# Patient Record
Sex: Male | Born: 1950 | Race: White | Hispanic: No | Marital: Married | Smoking: Former smoker
Health system: Southern US, Community
[De-identification: ages and names within clinical notes are randomized; demographics above are authoritative.]

## PROBLEM LIST (undated history)

## (undated) DIAGNOSIS — I4891 Unspecified atrial fibrillation: Secondary | ICD-10-CM

## (undated) DIAGNOSIS — E119 Type 2 diabetes mellitus without complications: Secondary | ICD-10-CM

## (undated) DIAGNOSIS — I10 Essential (primary) hypertension: Secondary | ICD-10-CM

## (undated) HISTORY — PX: WRIST SURGERY: SHX841

---

## 2003-08-29 ENCOUNTER — Ambulatory Visit (HOSPITAL_COMMUNITY): Admission: RE | Admit: 2003-08-29 | Discharge: 2003-08-30 | Payer: Self-pay | Admitting: Orthopedic Surgery

## 2015-02-25 DIAGNOSIS — D489 Neoplasm of uncertain behavior, unspecified: Secondary | ICD-10-CM | POA: Diagnosis not present

## 2015-02-25 DIAGNOSIS — Z6833 Body mass index (BMI) 33.0-33.9, adult: Secondary | ICD-10-CM | POA: Diagnosis not present

## 2015-04-15 DIAGNOSIS — Z23 Encounter for immunization: Secondary | ICD-10-CM | POA: Diagnosis not present

## 2015-05-14 DIAGNOSIS — J209 Acute bronchitis, unspecified: Secondary | ICD-10-CM | POA: Diagnosis not present

## 2015-09-01 DIAGNOSIS — I10 Essential (primary) hypertension: Secondary | ICD-10-CM | POA: Diagnosis not present

## 2015-09-01 DIAGNOSIS — E669 Obesity, unspecified: Secondary | ICD-10-CM | POA: Diagnosis not present

## 2015-09-01 DIAGNOSIS — Z125 Encounter for screening for malignant neoplasm of prostate: Secondary | ICD-10-CM | POA: Diagnosis not present

## 2015-09-01 DIAGNOSIS — Z9181 History of falling: Secondary | ICD-10-CM | POA: Diagnosis not present

## 2015-09-01 DIAGNOSIS — N529 Male erectile dysfunction, unspecified: Secondary | ICD-10-CM | POA: Diagnosis not present

## 2015-09-01 DIAGNOSIS — Z6834 Body mass index (BMI) 34.0-34.9, adult: Secondary | ICD-10-CM | POA: Diagnosis not present

## 2015-09-01 DIAGNOSIS — Z1389 Encounter for screening for other disorder: Secondary | ICD-10-CM | POA: Diagnosis not present

## 2015-09-01 DIAGNOSIS — Z0000 Encounter for general adult medical examination without abnormal findings: Secondary | ICD-10-CM | POA: Diagnosis not present

## 2015-09-01 DIAGNOSIS — Z23 Encounter for immunization: Secondary | ICD-10-CM | POA: Diagnosis not present

## 2015-11-14 DIAGNOSIS — H8113 Benign paroxysmal vertigo, bilateral: Secondary | ICD-10-CM | POA: Diagnosis not present

## 2015-11-14 DIAGNOSIS — S0502XA Injury of conjunctiva and corneal abrasion without foreign body, left eye, initial encounter: Secondary | ICD-10-CM | POA: Diagnosis not present

## 2015-12-13 DIAGNOSIS — H401121 Primary open-angle glaucoma, left eye, mild stage: Secondary | ICD-10-CM | POA: Diagnosis not present

## 2016-01-10 DIAGNOSIS — H4010X3 Unspecified open-angle glaucoma, severe stage: Secondary | ICD-10-CM | POA: Diagnosis not present

## 2016-04-21 DIAGNOSIS — Z23 Encounter for immunization: Secondary | ICD-10-CM | POA: Diagnosis not present

## 2016-06-14 DIAGNOSIS — H401121 Primary open-angle glaucoma, left eye, mild stage: Secondary | ICD-10-CM | POA: Diagnosis not present

## 2016-06-14 DIAGNOSIS — H401111 Primary open-angle glaucoma, right eye, mild stage: Secondary | ICD-10-CM | POA: Diagnosis not present

## 2016-09-05 DIAGNOSIS — N529 Male erectile dysfunction, unspecified: Secondary | ICD-10-CM | POA: Diagnosis not present

## 2016-09-05 DIAGNOSIS — E785 Hyperlipidemia, unspecified: Secondary | ICD-10-CM | POA: Diagnosis not present

## 2016-09-05 DIAGNOSIS — Z6835 Body mass index (BMI) 35.0-35.9, adult: Secondary | ICD-10-CM | POA: Diagnosis not present

## 2016-09-05 DIAGNOSIS — R739 Hyperglycemia, unspecified: Secondary | ICD-10-CM | POA: Diagnosis not present

## 2016-09-05 DIAGNOSIS — Z1389 Encounter for screening for other disorder: Secondary | ICD-10-CM | POA: Diagnosis not present

## 2016-09-05 DIAGNOSIS — Z0000 Encounter for general adult medical examination without abnormal findings: Secondary | ICD-10-CM | POA: Diagnosis not present

## 2016-09-05 DIAGNOSIS — Z9181 History of falling: Secondary | ICD-10-CM | POA: Diagnosis not present

## 2016-09-05 DIAGNOSIS — Z125 Encounter for screening for malignant neoplasm of prostate: Secondary | ICD-10-CM | POA: Diagnosis not present

## 2016-09-05 DIAGNOSIS — I10 Essential (primary) hypertension: Secondary | ICD-10-CM | POA: Diagnosis not present

## 2016-09-06 DIAGNOSIS — Z23 Encounter for immunization: Secondary | ICD-10-CM | POA: Diagnosis not present

## 2016-09-25 DIAGNOSIS — H401121 Primary open-angle glaucoma, left eye, mild stage: Secondary | ICD-10-CM | POA: Diagnosis not present

## 2016-09-25 DIAGNOSIS — H401111 Primary open-angle glaucoma, right eye, mild stage: Secondary | ICD-10-CM | POA: Diagnosis not present

## 2016-09-27 DIAGNOSIS — H40001 Preglaucoma, unspecified, right eye: Secondary | ICD-10-CM | POA: Diagnosis not present

## 2016-09-27 DIAGNOSIS — H25813 Combined forms of age-related cataract, bilateral: Secondary | ICD-10-CM | POA: Diagnosis not present

## 2016-09-27 DIAGNOSIS — H4032X3 Glaucoma secondary to eye trauma, left eye, severe stage: Secondary | ICD-10-CM | POA: Diagnosis not present

## 2016-09-27 DIAGNOSIS — H1851 Endothelial corneal dystrophy: Secondary | ICD-10-CM | POA: Diagnosis not present

## 2016-11-09 DIAGNOSIS — H40001 Preglaucoma, unspecified, right eye: Secondary | ICD-10-CM | POA: Diagnosis not present

## 2016-11-09 DIAGNOSIS — H4032X3 Glaucoma secondary to eye trauma, left eye, severe stage: Secondary | ICD-10-CM | POA: Diagnosis not present

## 2016-12-25 DIAGNOSIS — H25813 Combined forms of age-related cataract, bilateral: Secondary | ICD-10-CM | POA: Diagnosis not present

## 2016-12-25 DIAGNOSIS — H401123 Primary open-angle glaucoma, left eye, severe stage: Secondary | ICD-10-CM | POA: Diagnosis not present

## 2017-03-06 DIAGNOSIS — H60509 Unspecified acute noninfective otitis externa, unspecified ear: Secondary | ICD-10-CM | POA: Diagnosis not present

## 2017-03-06 DIAGNOSIS — Z6834 Body mass index (BMI) 34.0-34.9, adult: Secondary | ICD-10-CM | POA: Diagnosis not present

## 2017-03-30 DIAGNOSIS — H401123 Primary open-angle glaucoma, left eye, severe stage: Secondary | ICD-10-CM | POA: Diagnosis not present

## 2017-04-09 DIAGNOSIS — Z23 Encounter for immunization: Secondary | ICD-10-CM | POA: Diagnosis not present

## 2017-05-23 DIAGNOSIS — J029 Acute pharyngitis, unspecified: Secondary | ICD-10-CM | POA: Diagnosis not present

## 2017-05-23 DIAGNOSIS — J301 Allergic rhinitis due to pollen: Secondary | ICD-10-CM | POA: Diagnosis not present

## 2017-05-23 DIAGNOSIS — Z6835 Body mass index (BMI) 35.0-35.9, adult: Secondary | ICD-10-CM | POA: Diagnosis not present

## 2017-05-23 DIAGNOSIS — J019 Acute sinusitis, unspecified: Secondary | ICD-10-CM | POA: Diagnosis not present

## 2017-05-23 DIAGNOSIS — E669 Obesity, unspecified: Secondary | ICD-10-CM | POA: Diagnosis not present

## 2017-07-26 DIAGNOSIS — J189 Pneumonia, unspecified organism: Secondary | ICD-10-CM | POA: Diagnosis not present

## 2017-07-26 DIAGNOSIS — Z6835 Body mass index (BMI) 35.0-35.9, adult: Secondary | ICD-10-CM | POA: Diagnosis not present

## 2017-07-26 DIAGNOSIS — J9801 Acute bronchospasm: Secondary | ICD-10-CM | POA: Diagnosis not present

## 2017-07-26 DIAGNOSIS — R05 Cough: Secondary | ICD-10-CM | POA: Diagnosis not present

## 2017-07-27 DIAGNOSIS — R6889 Other general symptoms and signs: Secondary | ICD-10-CM | POA: Diagnosis not present

## 2017-07-31 DIAGNOSIS — H401111 Primary open-angle glaucoma, right eye, mild stage: Secondary | ICD-10-CM | POA: Diagnosis not present

## 2017-07-31 DIAGNOSIS — H401123 Primary open-angle glaucoma, left eye, severe stage: Secondary | ICD-10-CM | POA: Diagnosis not present

## 2017-07-31 DIAGNOSIS — Z0100 Encounter for examination of eyes and vision without abnormal findings: Secondary | ICD-10-CM | POA: Diagnosis not present

## 2017-08-14 ENCOUNTER — Emergency Department (HOSPITAL_COMMUNITY): Payer: Commercial Managed Care - HMO

## 2017-08-14 ENCOUNTER — Other Ambulatory Visit: Payer: Self-pay

## 2017-08-14 ENCOUNTER — Encounter (HOSPITAL_COMMUNITY): Payer: Self-pay | Admitting: Emergency Medicine

## 2017-08-14 ENCOUNTER — Emergency Department (HOSPITAL_COMMUNITY)
Admission: EM | Admit: 2017-08-14 | Discharge: 2017-08-14 | Disposition: A | Discharge status: Home / Self Care | Payer: Commercial Managed Care - HMO | Attending: Emergency Medicine | Admitting: Emergency Medicine

## 2017-08-14 DIAGNOSIS — S61219A Laceration without foreign body of unspecified finger without damage to nail, initial encounter: Secondary | ICD-10-CM

## 2017-08-14 DIAGNOSIS — Y929 Unspecified place or not applicable: Secondary | ICD-10-CM | POA: Insufficient documentation

## 2017-08-14 DIAGNOSIS — S61211A Laceration without foreign body of left index finger without damage to nail, initial encounter: Secondary | ICD-10-CM | POA: Diagnosis not present

## 2017-08-14 DIAGNOSIS — Y939 Activity, unspecified: Secondary | ICD-10-CM | POA: Diagnosis not present

## 2017-08-14 DIAGNOSIS — I10 Essential (primary) hypertension: Secondary | ICD-10-CM | POA: Insufficient documentation

## 2017-08-14 DIAGNOSIS — W293XXA Contact with powered garden and outdoor hand tools and machinery, initial encounter: Secondary | ICD-10-CM | POA: Insufficient documentation

## 2017-08-14 DIAGNOSIS — S62655A Nondisplaced fracture of medial phalanx of left ring finger, initial encounter for closed fracture: Secondary | ICD-10-CM | POA: Diagnosis not present

## 2017-08-14 DIAGNOSIS — S61213A Laceration without foreign body of left middle finger without damage to nail, initial encounter: Secondary | ICD-10-CM | POA: Insufficient documentation

## 2017-08-14 DIAGNOSIS — Y999 Unspecified external cause status: Secondary | ICD-10-CM | POA: Insufficient documentation

## 2017-08-14 HISTORY — DX: Essential (primary) hypertension: I10

## 2017-08-14 MED ORDER — BUPIVACAINE HCL 0.25 % IJ SOLN: 10.0000 mL | Freq: Once | INTRAMUSCULAR | Status: DC

## 2017-08-14 MED ORDER — OXYCODONE-ACETAMINOPHEN 5-325 MG PO TABS
1.0000 | ORAL_TABLET | Freq: Once | ORAL | Status: AC
  Administered 2017-08-14: 1 via ORAL
  Filled 2017-08-14: qty 1

## 2017-08-14 MED ORDER — HYDROCODONE-ACETAMINOPHEN 5-325 MG PO TABS: 1.0000 | ORAL_TABLET | Freq: Four times a day (QID) | ORAL | 0 refills | Status: DC | PRN

## 2017-08-14 MED ORDER — CEPHALEXIN 500 MG PO CAPS: 500.0000 mg | ORAL_CAPSULE | Freq: Two times a day (BID) | ORAL | 0 refills | Status: DC

## 2017-08-14 MED ORDER — BUPIVACAINE HCL (PF) 0.25 % IJ SOLN
10.0000 mL | Freq: Once | INTRAMUSCULAR | Status: AC
  Administered 2017-08-14: 10 mL
  Filled 2017-08-14: qty 30

## 2017-08-14 MED ORDER — CEPHALEXIN 250 MG PO CAPS
500.0000 mg | ORAL_CAPSULE | Freq: Once | ORAL | Status: AC
  Administered 2017-08-14: 500 mg via ORAL
  Filled 2017-08-14: qty 2

## 2017-08-14 MED ORDER — LIDOCAINE HCL 2 % IJ SOLN
20.0000 mL | Freq: Once | INTRAMUSCULAR | Status: AC
  Administered 2017-08-14: 400 mg
  Filled 2017-08-14: qty 20

## 2017-08-14 NOTE — ED Provider Notes (Signed)
Patient placed in Quick Look pathway, seen and evaluated   Chief Complaint: left second and third digit laceration.  HPI:   Patient presents with lacerations to left 2nd and 3rd digits after using a hand saw.  Tetanus up-to-date.  Went to urgent care, possible fracture.  Endorses numbness to the distal tip of the second digit.  He is not on blood thinners.  States his heart rate at baseline is around 60bpm but has not followed up with his cardiologist in almost 1 year.  Takes 200 mg of metoprolol daily.  ROS: +wound, +numbness  Physical Exam:   Gen: No distress  Neuro: Awake and Alert  Skin: Warm, dry    Focused Exam: Normal range of motion of the left digits and wrist.  Tenderness to palpation overlying a 5 cm jagged laceration to the palmar aspect of the left second digit.  Also has a more superficial laceration to the left third digit.  Decreased sensation to the distal tip of the left second digit.  5/5 strength of wrist and digits with flexion and extension against resistance.  No snuffbox tenderness.   Initiation of care has begun. The patient has been counseled on the process, plan, and necessity for staying for the completion/evaluation, and the remainder of the medical screening examination    Debroah Baller 08/14/17 1327    Carmin Muskrat, MD 08/14/17 [phone]

## 2017-08-14 NOTE — ED Notes (Signed)
VSS. Pt verbalized understanding of d/c instructions and prescriptions as well as f/u. Ambulatory to lobby with significant other

## 2017-08-14 NOTE — ED Triage Notes (Addendum)
Pt seen at urgent care for left pointer and middle. Sent here for further evaluation. End is numb. Up to date on tetanus. He is bradycardic but takes 200mg  metoprolol.

## 2017-08-14 NOTE — Discharge Instructions (Signed)
Please read attached information. If you experience any new or worsening signs or symptoms please return to the emergency room for evaluation. Please follow-up with your primary care provider or specialist as discussed. Please use medication prescribed only as directed and discontinue taking if you have any concerning signs or symptoms.   °

## 2017-08-14 NOTE — ED Notes (Addendum)
Suture cart and lidocaine at bedside.  

## 2017-08-14 NOTE — ED Provider Notes (Signed)
Frank Chase Provider Note   CSN: [id number] Arrival date & time: 08/14/17  1202     History   Chief Complaint Chief Complaint  Patient presents with  . Extremity Laceration    HPI Frank Chase is a 67 y.o. male.  HPI   67 year old male presents today with complaints of finger laceration.  She notes he was using a skill saw when he cut his second and third digits of his left hand.  Tetanus is up-to-date.  He was seen in urgent care and encouraged to come to the emergency room.  Patient reports some numbness at the distal tip of the second digit.  Patient not currently on blood thinners, denies any other injuries.  Past Medical History:  Diagnosis Date  . Hypertension     There are no active problems to display for this patient.   History reviewed. No pertinent surgical history.     Home Medications    Prior to Admission medications   Medication Sig Start Date End Date Taking? Authorizing Provider  cephALEXin (KEFLEX) 500 MG capsule Take 1 capsule (500 mg total) by mouth 2 (two) times daily. 08/14/17   , Dellis Filbert, PA-C  HYDROcodone-acetaminophen (NORCO/VICODIN) 5-325 MG tablet Take 1 tablet by mouth every 6 (six) hours as needed. 08/14/17   Okey Regal, PA-C    Family History History reviewed. No pertinent family history.  Social History Social History   Tobacco Use  . Smoking status: Not on file  Substance Use Topics  . Alcohol use: Not on file  . Drug use: Not on file    Allergies   Patient has no known allergies.   Review of Systems Review of Systems  All other systems reviewed and are negative.  Physical Exam Updated Vital Signs BP (!) 156/79   Pulse (!) 47   Temp 98.1 F (36.7 C) (Oral)   Resp 18   SpO2 99%    Physical Exam  Constitutional: He is oriented to person, place, and time. He appears well-developed and well-nourished.  HENT:  Head: Normocephalic and atraumatic.  Eyes:  Conjunctivae are normal. Pupils are equal, round, and reactive to light. Right eye exhibits no discharge. Left eye exhibits no discharge. No scleral icterus.  Neck: Normal range of motion. No JVD present. No tracheal deviation present.  Pulmonary/Chest: Effort normal. No stridor.  Musculoskeletal:  Several lacerations noted to the palmar aspect of the left second finger at the distal end, no sign of vascular tendon or bony involvement-full active flexion and extension of the DIP PIP  1 cm laceration over the distal third digit no deep space involvement flexion range of motion  Neurological: He is alert and oriented to person, place, and time. Coordination normal.  Psychiatric: He has a normal mood and affect. His behavior is normal. Judgment and thought content normal.  Nursing note and vitals reviewed.   ED Treatments / Results  Labs (all labs ordered are listed, but only abnormal results are displayed) Labs Reviewed - No data to display  EKG  EKG Interpretation None       Radiology Dg Hand Complete Left  Result Date: 08/14/2017 CLINICAL DATA:  Lacerations with a saw to the PIP joint of the index finger and the distal aspect of the middle finger EXAM: LEFT HAND - COMPLETE 3+ VIEW COMPARISON:  None FINDINGS: Dressing artifacts at distal aspects of LEFT index and middle fingers. Osseous mineralization normal. Joint spaces preserved. Tiny opacities are seen at the radial  aspect of the distal phalanx index finger consistent with tiny radiopaque foreign bodies. No definite acute fracture, dislocation, or bone destruction. IMPRESSION: No definite acute osseous abnormalities. Tiny radiopaque foreign bodies at distal phalanx LEFT index finger. Electronically Signed   By: Lavonia Dana M.D.   On: 08/14/2017 13:58    Procedures .Marland KitchenLaceration Repair Date/Time: 08/14/2017 5:03 PM Performed by: Okey Regal, PA-C Authorized by: Okey Regal, PA-C   Consent:    Consent obtained:  Verbal    Consent given by:  Patient   Risks discussed:  Infection, need for additional repair, nerve damage, pain, poor wound healing, retained foreign body and tendon damage   Alternatives discussed:  No treatment Anesthesia (see MAR for exact dosages):    Anesthesia method:  Nerve block   Block needle gauge:  25 G   Block anesthetic:  Lidocaine 2% w/o epi and bupivacaine 0.25% w/o epi   Block technique:  Digital block of the second and third digits left hand   Block injection procedure:  Anatomic landmarks identified, introduced needle, incremental injection, negative aspiration for blood and anatomic landmarks palpated   Block outcome:  Anesthesia achieved Laceration details:    Location: Left second and third fingers.   Length (cm):  5 Repair type:    Repair type:  Simple Pre-procedure details:    Preparation:  Patient was prepped and draped in usual sterile fashion Exploration:    Hemostasis achieved with:  Direct pressure and tourniquet   Wound exploration: wound explored through full range of motion and entire depth of wound probed and visualized     Wound extent: nerve damage     Wound extent: no fascia violation noted, no foreign bodies/material noted, no muscle damage noted, no tendon damage noted, no underlying fracture noted and no vascular damage noted     Contaminated: yes   Treatment:    Area cleansed with:  Saline and soap and water   Amount of cleaning:  Extensive   Irrigation solution:  Tap water and sterile saline   Irrigation volume:  Copious   Visualized foreign bodies/material removed: no   Skin repair:    Repair method:  Sutures   Suture size:  4-0   Suture material:  Fast-absorbing gut   Suture technique:  Simple interrupted   Number of sutures:  12 Approximation:    Approximation:  Close   Vermilion border: well-aligned   Post-procedure details:    Dressing:  Antibiotic ointment, non-adherent dressing and bulky dressing   Patient tolerance of procedure:   Tolerated well, no immediate complications Comments:     I personally bandaged and splinted the laceration and fingers using an aluminum straight splint   (including critical care time)  Medications Ordered in ED Medications  cephALEXin (KEFLEX) capsule 500 mg (not administered)  oxyCODONE-acetaminophen (PERCOCET/ROXICET) 5-325 MG per tablet 1 tablet (1 tablet Oral Given 08/14/17 1526)  lidocaine (XYLOCAINE) 2 % (with pres) injection 400 mg (400 mg Infiltration Given by Other 08/14/17 1520)  bupivacaine (PF) (MARCAINE) 0.25 % injection 10 mL (10 mLs Infiltration Given 08/14/17 1527)     Initial Impression / Assessment and Plan / ED Course  I have reviewed the triage vital signs and the nursing notes.  Pertinent labs & imaging results that were available during my care of the patient were reviewed by me and considered in my medical decision making (see chart for details).     Final Clinical Impressions(s) / ED Diagnoses   Final diagnoses:  Laceration of finger of  left hand without foreign body without damage to nail, unspecified finger, initial encounter    Labs:   Imaging:  Consults:  Therapeutics: Keflex, lidocaine, bupivacaine  Discharge Meds: Keflex, Norco  Assessment/Plan: 67 year old male presents today with lacerations.  These were copiously irrigated and closed.  No signs of deep space involvement full active range of motion of fingers.  Wound is bandaged patient placed on prophylactic antibiotics.  Tetanus up-to-date.  Strict return precautions given.  Patient verbalized understanding and agreement to today's plan.   ED Discharge Orders        Ordered    cephALEXin (KEFLEX) 500 MG capsule  2 times daily     08/14/17 1707    HYDROcodone-acetaminophen (NORCO/VICODIN) 5-325 MG tablet  Every 6 hours PRN     08/14/17 1708       Okey Regal, PA-C 08/14/17 1709    Fredia Sorrow, MD 08/15/17 1715

## 2017-08-14 NOTE — ED Notes (Signed)
EDP notified suture cart by the bedside

## 2017-08-14 NOTE — ED Notes (Signed)
ED Provider at bedside. 

## 2017-09-06 DIAGNOSIS — I10 Essential (primary) hypertension: Secondary | ICD-10-CM | POA: Diagnosis not present

## 2017-09-06 DIAGNOSIS — E785 Hyperlipidemia, unspecified: Secondary | ICD-10-CM | POA: Diagnosis not present

## 2017-09-06 DIAGNOSIS — Z6835 Body mass index (BMI) 35.0-35.9, adult: Secondary | ICD-10-CM | POA: Diagnosis not present

## 2017-09-06 DIAGNOSIS — R739 Hyperglycemia, unspecified: Secondary | ICD-10-CM | POA: Diagnosis not present

## 2017-09-06 DIAGNOSIS — Z125 Encounter for screening for malignant neoplasm of prostate: Secondary | ICD-10-CM | POA: Diagnosis not present

## 2017-09-06 DIAGNOSIS — Z0000 Encounter for general adult medical examination without abnormal findings: Secondary | ICD-10-CM | POA: Diagnosis not present

## 2017-12-10 DIAGNOSIS — Z1339 Encounter for screening examination for other mental health and behavioral disorders: Secondary | ICD-10-CM | POA: Diagnosis not present

## 2017-12-10 DIAGNOSIS — E669 Obesity, unspecified: Secondary | ICD-10-CM | POA: Diagnosis not present

## 2017-12-10 DIAGNOSIS — Z1331 Encounter for screening for depression: Secondary | ICD-10-CM | POA: Diagnosis not present

## 2017-12-10 DIAGNOSIS — I10 Essential (primary) hypertension: Secondary | ICD-10-CM | POA: Diagnosis not present

## 2017-12-10 DIAGNOSIS — R739 Hyperglycemia, unspecified: Secondary | ICD-10-CM | POA: Diagnosis not present

## 2017-12-10 DIAGNOSIS — Z9181 History of falling: Secondary | ICD-10-CM | POA: Diagnosis not present

## 2017-12-10 DIAGNOSIS — L570 Actinic keratosis: Secondary | ICD-10-CM | POA: Diagnosis not present

## 2017-12-10 DIAGNOSIS — Z6834 Body mass index (BMI) 34.0-34.9, adult: Secondary | ICD-10-CM | POA: Diagnosis not present

## 2017-12-10 DIAGNOSIS — E785 Hyperlipidemia, unspecified: Secondary | ICD-10-CM | POA: Diagnosis not present

## 2017-12-25 DIAGNOSIS — M818 Other osteoporosis without current pathological fracture: Secondary | ICD-10-CM | POA: Diagnosis not present

## 2017-12-25 DIAGNOSIS — R739 Hyperglycemia, unspecified: Secondary | ICD-10-CM | POA: Diagnosis not present

## 2017-12-25 DIAGNOSIS — Z6833 Body mass index (BMI) 33.0-33.9, adult: Secondary | ICD-10-CM | POA: Diagnosis not present

## 2017-12-25 DIAGNOSIS — Z79899 Other long term (current) drug therapy: Secondary | ICD-10-CM | POA: Diagnosis not present

## 2017-12-25 DIAGNOSIS — E785 Hyperlipidemia, unspecified: Secondary | ICD-10-CM | POA: Diagnosis not present

## 2017-12-25 DIAGNOSIS — I10 Essential (primary) hypertension: Secondary | ICD-10-CM | POA: Diagnosis not present

## 2017-12-31 DIAGNOSIS — H401123 Primary open-angle glaucoma, left eye, severe stage: Secondary | ICD-10-CM | POA: Diagnosis not present

## 2017-12-31 DIAGNOSIS — H401111 Primary open-angle glaucoma, right eye, mild stage: Secondary | ICD-10-CM | POA: Diagnosis not present

## 2018-03-12 DIAGNOSIS — E785 Hyperlipidemia, unspecified: Secondary | ICD-10-CM | POA: Diagnosis not present

## 2018-03-12 DIAGNOSIS — I10 Essential (primary) hypertension: Secondary | ICD-10-CM | POA: Diagnosis not present

## 2018-03-12 DIAGNOSIS — Z6832 Body mass index (BMI) 32.0-32.9, adult: Secondary | ICD-10-CM | POA: Diagnosis not present

## 2018-03-12 DIAGNOSIS — R739 Hyperglycemia, unspecified: Secondary | ICD-10-CM | POA: Diagnosis not present

## 2018-03-12 DIAGNOSIS — Z23 Encounter for immunization: Secondary | ICD-10-CM | POA: Diagnosis not present

## 2018-03-12 DIAGNOSIS — Z1339 Encounter for screening examination for other mental health and behavioral disorders: Secondary | ICD-10-CM | POA: Diagnosis not present

## 2018-06-13 DIAGNOSIS — Z6833 Body mass index (BMI) 33.0-33.9, adult: Secondary | ICD-10-CM | POA: Diagnosis not present

## 2018-06-13 DIAGNOSIS — R739 Hyperglycemia, unspecified: Secondary | ICD-10-CM | POA: Diagnosis not present

## 2018-06-13 DIAGNOSIS — E785 Hyperlipidemia, unspecified: Secondary | ICD-10-CM | POA: Diagnosis not present

## 2018-06-13 DIAGNOSIS — I10 Essential (primary) hypertension: Secondary | ICD-10-CM | POA: Diagnosis not present

## 2018-06-26 DIAGNOSIS — H401111 Primary open-angle glaucoma, right eye, mild stage: Secondary | ICD-10-CM | POA: Diagnosis not present

## 2018-06-26 DIAGNOSIS — H401123 Primary open-angle glaucoma, left eye, severe stage: Secondary | ICD-10-CM | POA: Diagnosis not present

## 2018-08-08 DIAGNOSIS — R6889 Other general symptoms and signs: Secondary | ICD-10-CM | POA: Diagnosis not present

## 2018-08-08 DIAGNOSIS — J020 Streptococcal pharyngitis: Secondary | ICD-10-CM | POA: Diagnosis not present

## 2018-08-08 DIAGNOSIS — Z6833 Body mass index (BMI) 33.0-33.9, adult: Secondary | ICD-10-CM | POA: Diagnosis not present

## 2018-08-08 DIAGNOSIS — I10 Essential (primary) hypertension: Secondary | ICD-10-CM | POA: Diagnosis not present

## 2018-08-08 DIAGNOSIS — J029 Acute pharyngitis, unspecified: Secondary | ICD-10-CM | POA: Diagnosis not present

## 2018-09-19 DIAGNOSIS — I10 Essential (primary) hypertension: Secondary | ICD-10-CM | POA: Diagnosis not present

## 2018-09-19 DIAGNOSIS — Z125 Encounter for screening for malignant neoplasm of prostate: Secondary | ICD-10-CM | POA: Diagnosis not present

## 2018-09-19 DIAGNOSIS — Z6833 Body mass index (BMI) 33.0-33.9, adult: Secondary | ICD-10-CM | POA: Diagnosis not present

## 2018-09-19 DIAGNOSIS — R739 Hyperglycemia, unspecified: Secondary | ICD-10-CM | POA: Diagnosis not present

## 2018-09-19 DIAGNOSIS — E785 Hyperlipidemia, unspecified: Secondary | ICD-10-CM | POA: Diagnosis not present

## 2018-12-19 DIAGNOSIS — Z9181 History of falling: Secondary | ICD-10-CM | POA: Diagnosis not present

## 2018-12-19 DIAGNOSIS — Z1331 Encounter for screening for depression: Secondary | ICD-10-CM | POA: Diagnosis not present

## 2018-12-19 DIAGNOSIS — E785 Hyperlipidemia, unspecified: Secondary | ICD-10-CM | POA: Diagnosis not present

## 2018-12-19 DIAGNOSIS — R739 Hyperglycemia, unspecified: Secondary | ICD-10-CM | POA: Diagnosis not present

## 2018-12-19 DIAGNOSIS — N529 Male erectile dysfunction, unspecified: Secondary | ICD-10-CM | POA: Diagnosis not present

## 2018-12-19 DIAGNOSIS — I10 Essential (primary) hypertension: Secondary | ICD-10-CM | POA: Diagnosis not present

## 2019-01-02 DIAGNOSIS — H401123 Primary open-angle glaucoma, left eye, severe stage: Secondary | ICD-10-CM | POA: Diagnosis not present

## 2019-01-02 DIAGNOSIS — H40011 Open angle with borderline findings, low risk, right eye: Secondary | ICD-10-CM | POA: Diagnosis not present

## 2019-01-02 DIAGNOSIS — H2513 Age-related nuclear cataract, bilateral: Secondary | ICD-10-CM | POA: Diagnosis not present

## 2019-03-21 DIAGNOSIS — N529 Male erectile dysfunction, unspecified: Secondary | ICD-10-CM | POA: Diagnosis not present

## 2019-03-21 DIAGNOSIS — R739 Hyperglycemia, unspecified: Secondary | ICD-10-CM | POA: Diagnosis not present

## 2019-03-21 DIAGNOSIS — Z6834 Body mass index (BMI) 34.0-34.9, adult: Secondary | ICD-10-CM | POA: Diagnosis not present

## 2019-03-21 DIAGNOSIS — Z23 Encounter for immunization: Secondary | ICD-10-CM | POA: Diagnosis not present

## 2019-03-21 DIAGNOSIS — E785 Hyperlipidemia, unspecified: Secondary | ICD-10-CM | POA: Diagnosis not present

## 2019-03-21 DIAGNOSIS — I10 Essential (primary) hypertension: Secondary | ICD-10-CM | POA: Diagnosis not present

## 2019-03-27 DIAGNOSIS — M1711 Unilateral primary osteoarthritis, right knee: Secondary | ICD-10-CM | POA: Diagnosis not present

## 2019-04-14 DIAGNOSIS — I4891 Unspecified atrial fibrillation: Secondary | ICD-10-CM | POA: Diagnosis not present

## 2019-04-14 DIAGNOSIS — Z6834 Body mass index (BMI) 34.0-34.9, adult: Secondary | ICD-10-CM | POA: Diagnosis not present

## 2019-04-14 DIAGNOSIS — I10 Essential (primary) hypertension: Secondary | ICD-10-CM | POA: Diagnosis not present

## 2019-04-14 DIAGNOSIS — I499 Cardiac arrhythmia, unspecified: Secondary | ICD-10-CM | POA: Diagnosis not present

## 2019-04-17 DIAGNOSIS — I361 Nonrheumatic tricuspid (valve) insufficiency: Secondary | ICD-10-CM | POA: Diagnosis not present

## 2019-04-17 DIAGNOSIS — I4891 Unspecified atrial fibrillation: Secondary | ICD-10-CM | POA: Diagnosis not present

## 2019-04-21 DIAGNOSIS — I4891 Unspecified atrial fibrillation: Secondary | ICD-10-CM | POA: Diagnosis not present

## 2019-04-21 DIAGNOSIS — Z6834 Body mass index (BMI) 34.0-34.9, adult: Secondary | ICD-10-CM | POA: Diagnosis not present

## 2019-04-21 DIAGNOSIS — I10 Essential (primary) hypertension: Secondary | ICD-10-CM | POA: Diagnosis not present

## 2019-05-06 NOTE — H&P (View-Only) (Signed)
Cardiology Office Note   Date:  05/07/2019   ID:  [name], DOB April 17, 1951, MRN [id number]  PCP:  Frank Chase., MD  Cardiologist:   No primary care provider on file.   Chief Complaint  Patient presents with  . Atrial Fibrillation     History of Present Illness: Frank Chase is a 68 y.o. male who is referred by Frank Chase., MD for evaluation of atrial fib.  This was found incidentally by his wife.  She noted irregular heart rate and then found it on a blood pressure monitor.  He went to his primary care and was noted to be in atrial fibrillation.  He has had an echocardiogram which was essentially unremarkable.  He does not really feel the fibrillation.  He might have some decreased energy and a little less exercise tolerance but this is mild.  He does not have any presyncope or syncope.  He had one episode of shortness of breath while watching the TV a little while ago but this was one brief episode this is self-limited.  He denies any chest pressure, neck or arm discomfort.  He does not otherwise have any shortness of breath, PND or orthopnea.  He has no weight gain or edema.  Past Medical History:  Diagnosis Date  . Hypertension     Past Surgical History:  Procedure Laterality Date  . WRIST SURGERY       Current Outpatient Medications  Medication Sig Dispense Refill  . amLODipine (NORVASC) 5 MG tablet     . COSOPT 22.3-6.8 MG/ML ophthalmic solution Place 1 drop into the left eye 2 (two) times daily.    . metoprolol succinate (TOPROL-XL) 100 MG 24 hr tablet     . olmesartan-hydrochlorothiazide (BENICAR HCT) 40-12.5 MG tablet     . Travoprost, BAK Free, (TRAVATAN) 0.004 % SOLN ophthalmic solution     . XARELTO 20 MG TABS tablet      No current facility-administered medications for this visit.     Allergies:   Patient has no known allergies.    Social History:  The patient  reports that he quit smoking about 42 years ago. His smoking use included  cigarettes. He quit smokeless tobacco use about 35 years ago. He reports current alcohol use. He reports that he does not use drugs.   Family History:  The patient's family history includes Alzheimer's disease in his mother.    ROS:  Please see the history of present illness.   Otherwise, review of systems are positive for none.   All other systems are reviewed and negative.    PHYSICAL EXAM: VS:  BP (!) 151/97 (BP Location: Left Arm)   Pulse 94   Temp (!) 97.5 F (36.4 C)   Ht 5\' 10"  (1.778 m)   Wt 235 lb 3.2 oz (106.7 kg)   SpO2 97%   BMI 33.75 kg/m  , BMI Body mass index is 33.75 kg/m. GENERAL:  Well appearing HEENT:  Pupils equal round and reactive, fundi not visualized, oral mucosa unremarkable NECK:  No jugular venous distention, waveform within normal limits, carotid upstroke brisk and symmetric, no bruits, no thyromegaly LYMPHATICS:  No cervical, inguinal adenopathy LUNGS:  Clear to auscultation bilaterally BACK:  No CVA tenderness CHEST:  Unremarkable HEART:  PMI not displaced or sustained,S1 and S2 within normal limits, no S3, no clicks, no rubs, no murmurs, irregular ABD:  Flat, positive bowel sounds normal in frequency in pitch, no bruits, no rebound,  no guarding, no midline pulsatile mass, no hepatomegaly, no splenomegaly EXT:  2 plus pulses throughout, trace lower extremity edema, no cyanosis no clubbing SKIN:  No rashes no nodules NEURO:  Cranial nerves II through XII grossly intact, motor grossly intact throughout PSYCH:  Cognitively intact, oriented to person place and time    EKG:  EKG is ordered today. The ekg ordered today demonstrates atrial fibrillation, rate 90, axis within normal limits, intervals within normal limits, no acute ST-T wave changes.   Recent Labs: No results found for requested labs within last 8760 hours.    Lipid Panel No results found for: CHOL, TRIG, HDL, CHOLHDL, VLDL, LDLCALC, LDLDIRECT    Wt Readings from Last 3 Encounters:   05/07/19 235 lb 3.2 oz (106.7 kg)      Other studies Reviewed: Additional studies/ records that were reviewed today include: Echo, lab and office records. Review of the above records demonstrates:  Please see elsewhere in the note.     ASSESSMENT AND PLAN:  ATRIAL FIB:   The patient's rate is controlled.  He is tolerating anticoagulation.  I plan to send him for cardioversion.  He has been on anticoagulation for 3 weeks.  He had normal electrolytes and TSH.  I will see him back after that and make plan he is in recurrent fibrillation.  Frank Chase has a CHA2DS2 - VASc score of 2.    HTN: Blood pressure is mildly elevated.  Elected blood pressure diary and is typically not.  He will keep a watch on this and but if not and his blood pressure stays high will need further med adjustments.   Current medicines are reviewed at length with the patient today.  The patient does not have concerns regarding medicines.  The following changes have been made:  no change  Labs/ tests ordered today include:   Orders Placed This Encounter  Procedures  . CBC  . Basic metabolic panel  . EKG 12-Lead     Disposition:   FU with after the DCCV    Signed, Minus Breeding, MD  05/07/2019 3:35 PM    [facility]

## 2019-05-06 NOTE — Progress Notes (Signed)
Cardiology Office Note   Date:  05/07/2019   ID:  URA SLAIGHT, DOB 1951-02-19, MRN [id number]  PCP:  Frank Chase., MD  Cardiologist:   No primary care provider on file.   Chief Complaint  Patient presents with  . Atrial Fibrillation     History of Present Illness: Frank Chase is a 68 y.o. male who is referred by Frank Chase., MD for evaluation of atrial fib.  This was found incidentally by his wife.  She noted irregular heart rate and then found it on a blood pressure monitor.  He went to his primary care and was noted to be in atrial fibrillation.  He has had an echocardiogram which was essentially unremarkable.  He does not really feel the fibrillation.  He might have some decreased energy and a little less exercise tolerance but this is mild.  He does not have any presyncope or syncope.  He had one episode of shortness of breath while watching the TV a little while ago but this was one brief episode this is self-limited.  He denies any chest pressure, neck or arm discomfort.  He does not otherwise have any shortness of breath, PND or orthopnea.  He has no weight gain or edema.  Past Medical History:  Diagnosis Date  . Hypertension     Past Surgical History:  Procedure Laterality Date  . WRIST SURGERY       Current Outpatient Medications  Medication Sig Dispense Refill  . amLODipine (NORVASC) 5 MG tablet     . COSOPT 22.3-6.8 MG/ML ophthalmic solution Place 1 drop into the left eye 2 (two) times daily.    . metoprolol succinate (TOPROL-XL) 100 MG 24 hr tablet     . olmesartan-hydrochlorothiazide (BENICAR HCT) 40-12.5 MG tablet     . Travoprost, BAK Free, (TRAVATAN) 0.004 % SOLN ophthalmic solution     . XARELTO 20 MG TABS tablet      No current facility-administered medications for this visit.     Allergies:   Patient has no known allergies.    Social History:  The patient  reports that he quit smoking about 42 years ago. His smoking use included  cigarettes. He quit smokeless tobacco use about 35 years ago. He reports current alcohol use. He reports that he does not use drugs.   Family History:  The patient's family history includes Alzheimer's disease in his mother.    ROS:  Please see the history of present illness.   Otherwise, review of systems are positive for none.   All other systems are reviewed and negative.    PHYSICAL EXAM: VS:  BP (!) 151/97 (BP Location: Left Arm)   Pulse 94   Temp (!) 97.5 F (36.4 C)   Ht 5\' 10"  (1.778 m)   Wt 235 lb 3.2 oz (106.7 kg)   SpO2 97%   BMI 33.75 kg/m  , BMI Body mass index is 33.75 kg/m. GENERAL:  Well appearing HEENT:  Pupils equal round and reactive, fundi not visualized, oral mucosa unremarkable NECK:  No jugular venous distention, waveform within normal limits, carotid upstroke brisk and symmetric, no bruits, no thyromegaly LYMPHATICS:  No cervical, inguinal adenopathy LUNGS:  Clear to auscultation bilaterally BACK:  No CVA tenderness CHEST:  Unremarkable HEART:  PMI not displaced or sustained,S1 and S2 within normal limits, no S3, no clicks, no rubs, no murmurs, irregular ABD:  Flat, positive bowel sounds normal in frequency in pitch, no bruits, no rebound,  no guarding, no midline pulsatile mass, no hepatomegaly, no splenomegaly EXT:  2 plus pulses throughout, trace lower extremity edema, no cyanosis no clubbing SKIN:  No rashes no nodules NEURO:  Cranial nerves II through XII grossly intact, motor grossly intact throughout PSYCH:  Cognitively intact, oriented to person place and time    EKG:  EKG is ordered today. The ekg ordered today demonstrates atrial fibrillation, rate 90, axis within normal limits, intervals within normal limits, no acute ST-T wave changes.   Recent Labs: No results found for requested labs within last 8760 hours.    Lipid Panel No results found for: CHOL, TRIG, HDL, CHOLHDL, VLDL, LDLCALC, LDLDIRECT    Wt Readings from Last 3 Encounters:   05/07/19 235 lb 3.2 oz (106.7 kg)      Other studies Reviewed: Additional studies/ records that were reviewed today include: Echo, lab and office records. Review of the above records demonstrates:  Please see elsewhere in the note.     ASSESSMENT AND PLAN:  ATRIAL FIB:   The patient's rate is controlled.  He is tolerating anticoagulation.  I plan to send him for cardioversion.  He has been on anticoagulation for 3 weeks.  He had normal electrolytes and TSH.  I will see him back after that and make plan he is in recurrent fibrillation.  Frank Chase has a CHA2DS2 - VASc score of 2.    HTN: Blood pressure is mildly elevated.  Elected blood pressure diary and is typically not.  He will keep a watch on this and but if not and his blood pressure stays high will need further med adjustments.   Current medicines are reviewed at length with the patient today.  The patient does not have concerns regarding medicines.  The following changes have been made:  no change  Labs/ tests ordered today include:   Orders Placed This Encounter  Procedures  . CBC  . Basic metabolic panel  . EKG 12-Lead     Disposition:   FU with after the DCCV    Signed, Minus Breeding, MD  05/07/2019 3:35 PM    [facility]

## 2019-05-07 ENCOUNTER — Ambulatory Visit (INDEPENDENT_AMBULATORY_CARE_PROVIDER_SITE_OTHER): Payer: Medicare HMO | Admitting: Cardiology

## 2019-05-07 ENCOUNTER — Other Ambulatory Visit: Payer: Self-pay

## 2019-05-07 ENCOUNTER — Encounter: Payer: Self-pay | Admitting: Cardiology

## 2019-05-07 VITALS — BP 151/97 | HR 94 | Temp 97.5°F | Ht 70.0 in | Wt 235.2 lb

## 2019-05-07 DIAGNOSIS — I10 Essential (primary) hypertension: Secondary | ICD-10-CM | POA: Diagnosis not present

## 2019-05-07 DIAGNOSIS — I4891 Unspecified atrial fibrillation: Secondary | ICD-10-CM | POA: Diagnosis not present

## 2019-05-07 NOTE — Patient Instructions (Addendum)
Medication Instructions:  Your physician recommends that you continue on your current medications as directed. Please refer to the Current Medication list given to you today.  If you need a refill on your cardiac medications before your next appointment, please call your pharmacy.   Lab work: CBC, BMET If you have labs (blood work) drawn today and your tests are completely normal, you will receive your results only by: [place] (if you have MyChart) OR A paper copy in the mail If you have any lab test that is abnormal or we need to change your treatment, we will call you to review the results.  Testing/Procedures: Cardioversion on Monday November 23rd at 11am.   Your physician Dr Percival Spanish wants you to follow-up in: 2 weeks    Any Other Special Instructions Will Be Listed Below (If Applicable). Dear Frank Chase You are scheduled for a TEE Cardioversion on Monday November 23rd at 11:00am. with Dr. Acie Fredrickson.  Please arrive at the [facility] (Main Entrance A) at [facility]: [address] [place], [place] [address] at 10:00am. (1 hour prior to procedure unless lab work is needed; if lab work is needed arrive 1.5 hours ahead)  DIET: Nothing to eat or drink after midnight except a sip of water with medications (see medication instructions below)  Medication Instructions: Continue your anticoagulant: Xarelto You will need to continue your anticoagulant after your procedure until you  are told by your  Provider that it is safe to stop   Labs: CBC, BMET today.  You must have a responsible person to drive you home and stay in the waiting area during your procedure. Failure to do so could result in cancellation.  Bring your insurance cards.  *Special Note: Every effort is made to have your procedure done on time. Occasionally there are emergencies that occur at the hospital that may cause delays. Please be patient if a delay does occur.    You have to be tested for  Covid On Thursday November 19th @ 12:20. Arrive 10 minutes early. This is a Drive Up Visit at the [facility] [address] [place]. Someone will direct you to the appropriate testing line. Stay in your car and someone will be with you shortly.

## 2019-05-08 ENCOUNTER — Other Ambulatory Visit (HOSPITAL_COMMUNITY)
Admission: RE | Admit: 2019-05-08 | Discharge: 2019-05-08 | Disposition: A | Discharge status: Home / Self Care | Payer: Medicare HMO | Source: Ambulatory Visit | Attending: Cardiovascular Disease | Admitting: Cardiovascular Disease

## 2019-05-08 DIAGNOSIS — Z01812 Encounter for preprocedural laboratory examination: Secondary | ICD-10-CM | POA: Diagnosis not present

## 2019-05-08 DIAGNOSIS — Z20828 Contact with and (suspected) exposure to other viral communicable diseases: Secondary | ICD-10-CM | POA: Diagnosis not present

## 2019-05-08 LAB — BASIC METABOLIC PANEL
BUN/Creatinine Ratio: 12 (ref 10–24)
BUN: 13 mg/dL (ref 8–27)
CO2: 22 mmol/L (ref 20–29)
Calcium: 9.6 mg/dL (ref 8.6–10.2)
Chloride: 104 mmol/L (ref 96–106)
Creatinine, Ser: 1.05 mg/dL (ref 0.76–1.27)
GFR calc Af Amer: 83 mL/min/{1.73_m2} (ref >59)
GFR calc non Af Amer: 72 mL/min/{1.73_m2} (ref >59)
Glucose: 99 mg/dL (ref 65–99)
Potassium: 3.9 mmol/L (ref 3.5–5.2)
Sodium: 143 mmol/L (ref 134–144)

## 2019-05-08 LAB — CBC
Hematocrit: 46.8 % (ref 37.5–51.0)
Hemoglobin: 16.6 g/dL (ref 13.0–17.7)
MCH: 30.6 pg (ref 26.6–33.0)
MCHC: 35.5 g/dL (ref 31.5–35.7)
MCV: 86 fL (ref 79–97)
Platelets: 218 x10E3/uL (ref 150–450)
RBC: 5.43 x10E6/uL (ref 4.14–5.80)
RDW: 13 % (ref 11.6–15.4)
WBC: 7.2 x10E3/uL (ref 3.4–10.8)

## 2019-05-09 ENCOUNTER — Encounter: Payer: Self-pay | Admitting: *Deleted

## 2019-05-09 LAB — NOVEL CORONAVIRUS, NAA (HOSP ORDER, SEND-OUT TO REF LAB; TAT 18-24 HRS): SARS-CoV-2, NAA: NOT DETECTED

## 2019-05-12 ENCOUNTER — Encounter (HOSPITAL_COMMUNITY): Payer: Self-pay | Admitting: Emergency Medicine

## 2019-05-12 ENCOUNTER — Encounter (HOSPITAL_COMMUNITY)
Admission: RE | Disposition: A | Discharge status: Home / Self Care | Payer: Self-pay | Source: Home / Self Care | Attending: Cardiovascular Disease

## 2019-05-12 ENCOUNTER — Ambulatory Visit (HOSPITAL_COMMUNITY)
Admission: RE | Admit: 2019-05-12 | Discharge: 2019-05-12 | Disposition: A | Discharge status: Home / Self Care | Payer: Medicare HMO | Attending: Cardiovascular Disease | Admitting: Cardiovascular Disease

## 2019-05-12 ENCOUNTER — Ambulatory Visit (HOSPITAL_COMMUNITY): Payer: Medicare HMO | Admitting: Certified Registered Nurse Anesthetist

## 2019-05-12 ENCOUNTER — Other Ambulatory Visit: Payer: Self-pay

## 2019-05-12 DIAGNOSIS — D759 Disease of blood and blood-forming organs, unspecified: Secondary | ICD-10-CM | POA: Diagnosis not present

## 2019-05-12 DIAGNOSIS — I10 Essential (primary) hypertension: Secondary | ICD-10-CM | POA: Diagnosis not present

## 2019-05-12 DIAGNOSIS — Z87891 Personal history of nicotine dependence: Secondary | ICD-10-CM | POA: Insufficient documentation

## 2019-05-12 DIAGNOSIS — Z79899 Other long term (current) drug therapy: Secondary | ICD-10-CM | POA: Insufficient documentation

## 2019-05-12 DIAGNOSIS — Z7901 Long term (current) use of anticoagulants: Secondary | ICD-10-CM | POA: Insufficient documentation

## 2019-05-12 DIAGNOSIS — I4891 Unspecified atrial fibrillation: Secondary | ICD-10-CM | POA: Insufficient documentation

## 2019-05-12 HISTORY — PX: CARDIOVERSION: SHX1299

## 2019-05-12 SURGERY — CARDIOVERSION
Anesthesia: General

## 2019-05-12 MED ORDER — METOPROLOL SUCCINATE ER 50 MG PO TB24: 50.0000 mg | ORAL_TABLET | Freq: Every day | ORAL | 11 refills | Status: DC

## 2019-05-12 MED ORDER — PROPOFOL 10 MG/ML IV BOLUS
INTRAVENOUS | Status: DC | PRN
  Administered 2019-05-12: 70 mg via INTRAVENOUS

## 2019-05-12 MED ORDER — SODIUM CHLORIDE 0.9 % IV SOLN
INTRAVENOUS | Status: DC | PRN
  Administered 2019-05-12: 11:00:00 via INTRAVENOUS

## 2019-05-12 MED ORDER — LIDOCAINE 2% (20 MG/ML) 5 ML SYRINGE
INTRAMUSCULAR | Status: DC | PRN
  Administered 2019-05-12: 80 mg via INTRAVENOUS

## 2019-05-12 NOTE — Transfer of Care (Signed)
Immediate Anesthesia Transfer of Care Note  Patient: Frank Chase  Procedure(s) Performed: CARDIOVERSION (N/A )  Patient Location: Endoscopy Unit  Anesthesia Type:General  Level of Consciousness: drowsy  Airway & Oxygen Therapy: Patient Spontanous Breathing and Patient connected to nasal cannula oxygen  Post-op Assessment: Report given to RN and Post -op Vital signs reviewed and stable  Post vital signs: Reviewed  Last Vitals:  Vitals Value Taken Time  BP    Temp    Pulse    Resp    SpO2      Last Pain:  Vitals:   05/12/19 1042  TempSrc: Temporal  PainSc: 0-No pain         Complications: No apparent anesthesia complications

## 2019-05-12 NOTE — Interval H&P Note (Signed)
History and Physical Interval Note:  05/12/2019 11:14 AM  Frank Chase  has presented today for surgery, with the diagnosis of atrial fibrillation.  The various methods of treatment have been discussed with the patient and family. After consideration of risks, benefits and other options for treatment, the patient has consented to  Procedure(s): CARDIOVERSION (N/A) as a surgical intervention.  The patient's history has been reviewed, patient examined, no change in status, stable for surgery.  I have reviewed the patient's chart and labs.  Questions were answered to the patient's satisfaction.     Mertie Moores

## 2019-05-12 NOTE — Anesthesia Postprocedure Evaluation (Signed)
Anesthesia Post Note  Patient: Frank Chase  Procedure(s) Performed: CARDIOVERSION (N/A )     Patient location during evaluation: Endoscopy Anesthesia Type: General Level of consciousness: awake and alert Pain management: pain level controlled Vital Signs Assessment: post-procedure vital signs reviewed and stable Respiratory status: spontaneous breathing, nonlabored ventilation, respiratory function stable and patient connected to nasal cannula oxygen Cardiovascular status: blood pressure returned to baseline and stable Postop Assessment: no apparent nausea or vomiting Anesthetic complications: no    Last Vitals:  Vitals:   05/12/19 1140 05/12/19 1154  BP: 124/76 (!) 106/55  Pulse: (!) 51 (!) 55  Resp: 17   Temp:    SpO2: 95% 96%    Last Pain:  Vitals:   05/12/19 1154  TempSrc:   PainSc: 0-No pain                  L 

## 2019-05-12 NOTE — CV Procedure (Signed)
    Cardioversion Note  MASUO GOVEIA [id number] 19-Aug-1950  Procedure: DC Cardioversion Indications: atrial fib   Procedure Details Consent: Obtained Time Out: Verified patient identification, verified procedure, site/side was marked, verified correct patient position, special equipment/implants available, Radiology Safety Procedures followed,  medications/allergies/relevent history reviewed, required imaging and test results available.  Performed  The patient has been on adequate anticoagulation.  The patient received IV  Lidocaine 80 mg followed by propofol 70 mg IV  for sedation.  Synchronous cardioversion was performed at 200  joules.  The cardioversion was successful     Complications: No apparent complications Patient did tolerate procedure well.   Thayer Headings, Brooke Bonito., MD, [facility] 05/12/2019, 11:25 AM

## 2019-05-12 NOTE — Anesthesia Procedure Notes (Signed)
Date/Time: 05/12/2019 11:13 AM Performed by: Janene Harvey, CRNA Pre-anesthesia Checklist: Patient identified, Emergency Drugs available, Suction available and Patient being monitored Patient Re-evaluated:Patient Re-evaluated prior to induction Oxygen Delivery Method: Ambu bag Dental Injury: Teeth and Oropharynx as per pre-operative assessment

## 2019-05-12 NOTE — Anesthesia Preprocedure Evaluation (Addendum)
Anesthesia Evaluation  Patient identified by MRN, date of birth, ID band Patient awake    Reviewed: Allergy & Precautions, NPO status , Patient's Chart, lab work & pertinent test results, reviewed documented beta blocker date and time   Airway Mallampati: III  TM Distance: >3 FB Neck ROM: Full    Dental no notable dental hx. (+) Teeth Intact, Dental Advisory Given   Pulmonary neg pulmonary ROS, former smoker,    Pulmonary exam normal breath sounds clear to auscultation       Cardiovascular hypertension, Pt. on medications and Pt. on home beta blockers Normal cardiovascular exam+ dysrhythmias Atrial Fibrillation  Rhythm:Irregular Rate:Normal     Neuro/Psych negative neurological ROS  negative psych ROS   GI/Hepatic negative GI ROS, Neg liver ROS,   Endo/Other  negative endocrine ROS  Renal/GU negative Renal ROS  negative genitourinary   Musculoskeletal negative musculoskeletal ROS (+)   Abdominal   Peds  Hematology  (+) Blood dyscrasia (on xarelto), ,   Anesthesia Other Findings   Reproductive/Obstetrics                           Anesthesia Physical Anesthesia Plan  ASA: III  Anesthesia Plan: General   Post-op Pain Management:    Induction: Intravenous  PONV Risk Score and Plan: 2 and Propofol infusion and Treatment may vary due to age or medical condition  Airway Management Planned: Natural Airway  Additional Equipment:   Intra-op Plan:   Post-operative Plan:   Informed Consent: I have reviewed the patients History and Physical, chart, labs and discussed the procedure including the risks, benefits and alternatives for the proposed anesthesia with the patient or authorized representative who has indicated his/her understanding and acceptance.     Dental advisory given  Plan Discussed with: CRNA  Anesthesia Plan Comments:         Anesthesia Quick Evaluation

## 2019-05-12 NOTE — Discharge Instructions (Signed)

## 2019-05-28 DIAGNOSIS — I10 Essential (primary) hypertension: Secondary | ICD-10-CM | POA: Insufficient documentation

## 2019-05-28 DIAGNOSIS — Z7189 Other specified counseling: Secondary | ICD-10-CM | POA: Insufficient documentation

## 2019-05-28 DIAGNOSIS — I4819 Other persistent atrial fibrillation: Secondary | ICD-10-CM | POA: Insufficient documentation

## 2019-05-28 NOTE — Progress Notes (Signed)
Cardiology Office Note   Date:  05/29/2019   ID:  Frank, Chase 17-Nov-1950, MRN [id number]  PCP:  Frank Chase., MD  Cardiologist:   Minus Breeding, MD   Chief Complaint  Patient presents with  . Atrial Fibrillation     History of Present Illness: Frank Chase is a 68 y.o. male who is referred by Frank Chase., MD for evaluation of atrial fib.  He is status post DCCV.  He did well with this.  He is taking his blood pressure appropriately.  Therapy was 189/105 once.  He had a lower dose of beta-blocker suggested but he went back up to the 100 mg that he had been taking because of his blood pressure going up.  His blood pressure still been a little bit.  Occasionally monitors says his heart rate is irregular but has not been in the 90s which he was when he was in fibrillation.  He cannot really tell when he is in fibrillation so is unclear whether he is having paroxysms.  He feels okay. The patient denies any new symptoms such as chest discomfort, neck or arm discomfort. There has been no new shortness of breath, PND or orthopnea. There have been no reported palpitations, presyncope or syncope.    Past Medical History:  Diagnosis Date  . Hypertension     Past Surgical History:  Procedure Laterality Date  . CARDIOVERSION N/A 05/12/2019   Procedure: CARDIOVERSION;  Surgeon: Acie Fredrickson Wonda Cheng, MD;  Location: [facility] ENDOSCOPY;  Service: Cardiovascular;  Laterality: N/A;  . WRIST SURGERY       Current Outpatient Medications  Medication Sig Dispense Refill  . amLODipine (NORVASC) 10 MG tablet Take 1 tablet (10 mg total) by mouth at bedtime. 90 tablet 3  . COSOPT 22.3-6.8 MG/ML ophthalmic solution Place 1 drop into the left eye 2 (two) times daily.    . Glucosamine-Chondroitin (COSAMIN DS PO) Take 2 tablets by mouth at bedtime.    . metoprolol succinate (TOPROL-XL) 50 MG 24 hr tablet Take 1 tablet (50 mg total) by mouth at bedtime. (Patient taking differently: Take 50 mg by  mouth at bedtime. Patient is taking 100mg ) 30 tablet 11  . olmesartan-hydrochlorothiazide (BENICAR HCT) 40-12.5 MG tablet Take 1 tablet by mouth at bedtime.     . Travoprost, BAK Free, (TRAVATAN) 0.004 % SOLN ophthalmic solution Place 1 drop into both eyes at bedtime.     Alveda Reasons 20 MG TABS tablet Take 20 mg by mouth at bedtime.      No current facility-administered medications for this visit.    Allergies:   Patient has no known allergies.     ROS:  Please see the history of present illness.   Otherwise, review of systems are positive for none.   All other systems are reviewed and negative.    PHYSICAL EXAM: VS:  BP 131/73   Pulse (!) 52   Temp 97.7 F (36.5 C)   Ht 5\' 10"  (1.778 m)   Wt 236 lb 6.4 oz (107.2 kg)   SpO2 98%   BMI 33.92 kg/m  , BMI Body mass index is 33.92 kg/m. GENERAL:  Well appearing NECK:  No jugular venous distention, waveform within normal limits, carotid upstroke brisk and symmetric, no bruits, no thyromegaly LUNGS:  Clear to auscultation bilaterally CHEST:  Unremarkable HEART:  PMI not displaced or sustained,S1 and S2 within normal limits, no S3, no S4, no clicks, no rubs, no murmurs ABD:  Flat,  positive bowel sounds normal in frequency in pitch, no bruits, no rebound, no guarding, no midline pulsatile mass, no hepatomegaly, no splenomegaly EXT:  2 plus pulses throughout, no edema, no cyanosis no clubbing   EKG:  EKG is  not ordered today.   Recent Labs: 05/07/2019: BUN 13; Creatinine, Ser 1.05; Hemoglobin 16.6; Platelets 218; Potassium 3.9; Sodium 143    Lipid Panel No results found for: CHOL, TRIG, HDL, CHOLHDL, VLDL, LDLCALC, LDLDIRECT    Wt Readings from Last 3 Encounters:  05/29/19 236 lb 6.4 oz (107.2 kg)  05/12/19 230 lb (104.3 kg)  05/07/19 235 lb 3.2 oz (106.7 kg)      Other studies Reviewed: Additional studies/ records that were reviewed today include: Hospital Records Review of the above records demonstrates:  Please see  elsewhere in the note.     ASSESSMENT AND PLAN:  ATRIAL FIB:    Mr. Frank Chase has a CHA2DS2 - VASc score of 2.  I think he is probably maintaining sinus rhythm.  However, I suggested that he get an Alive Cor to track this.  Regardless I am not going to discontinue his anticoagulation given his risk score.  HTN: Blood pressure is still elevated despite the fact that he went up on his own beta-blocker.  I am going to increase his Norvasc to 10 mg daily and he will keep his blood pressure diary.   COVID 19 EDUCATION: He is given a wait-and-see about the vaccine.  We discussed this.  Current medicines are reviewed at length with the patient today.  The patient does not have concerns regarding medicines.  The following changes have been made:  As above  Labs/ tests ordered today include: None  No orders of the defined types were placed in this encounter.    Disposition:   FU with me in six months.     Signed, Minus Breeding, MD  05/29/2019 9:50 AM    [place]

## 2019-05-29 ENCOUNTER — Other Ambulatory Visit: Payer: Self-pay

## 2019-05-29 ENCOUNTER — Ambulatory Visit (INDEPENDENT_AMBULATORY_CARE_PROVIDER_SITE_OTHER): Payer: Medicare HMO | Admitting: Cardiology

## 2019-05-29 ENCOUNTER — Encounter: Payer: Self-pay | Admitting: Cardiology

## 2019-05-29 VITALS — BP 131/73 | HR 52 | Temp 97.7°F | Ht 70.0 in | Wt 236.4 lb

## 2019-05-29 DIAGNOSIS — I4891 Unspecified atrial fibrillation: Secondary | ICD-10-CM

## 2019-05-29 DIAGNOSIS — Z7901 Long term (current) use of anticoagulants: Secondary | ICD-10-CM

## 2019-05-29 DIAGNOSIS — I10 Essential (primary) hypertension: Secondary | ICD-10-CM | POA: Diagnosis not present

## 2019-05-29 DIAGNOSIS — Z7189 Other specified counseling: Secondary | ICD-10-CM

## 2019-05-29 DIAGNOSIS — I4819 Other persistent atrial fibrillation: Secondary | ICD-10-CM

## 2019-05-29 MED ORDER — AMLODIPINE BESYLATE 10 MG PO TABS: 10.0000 mg | ORAL_TABLET | Freq: Every day | ORAL | 3 refills | Status: DC

## 2019-05-29 NOTE — Patient Instructions (Addendum)
Medication Instructions:  Your physician has recommended you make the following change in your medication:   [place] TO 10 MG BY MOUTH DAILY  *If you need a refill on your cardiac medications before your next appointment, please call your pharmacy*  Lab Work: NONE If you have labs (blood work) drawn today and your tests are completely normal, you will receive your results only by: Marland Kitchen MyChart Message (if you have MyChart) OR . A paper copy in the mail If you have any lab test that is abnormal or we need to change your treatment, we will call you to review the results.  Testing/Procedures: NONE  Follow-Up: At [facility], you and your health needs are our priority.  As part of our continuing mission to provide you with exceptional heart care, we have created designated Provider Care Teams.  These Care Teams include your primary Cardiologist (physician) and Advanced Practice Providers (APPs -  Physician Assistants and Nurse Practitioners) who all work together to provide you with the care you need, when you need it.  Your next appointment:   6 month(s)  The format for your next appointment:   In Person  Provider:   Minus Breeding, MD  Other Instructions AliveCor Jodelle Red    Thank you for enrolling in [facility]. Please follow the instructions below to securely access your online medical record. MyChart allows you to send messages to your doctor, view your test results, manage appointments, and more.   How Do I Sign Up? 1. In your Internet browser, go to [employer] and enter [email or web address]. 2. Click on the Sign Up Now link in the Sign In box. You will see the New Member Sign Up page. 3. Enter your MyChart Access Code exactly as it appears below. You will not need to use this code after you've completed the sign-up process. If you do not sign up before the expiration date, you must request a new code.  MyChart Access Code:  [email or web address] Expires: 07/13/2019  9:39 AM  4. Enter your Social Security Number ([id number]) and Date of Birth (mm/dd/yyyy) as indicated and click Submit. You will be taken to the next sign-up page. 5. Create a MyChart ID. This will be your MyChart login ID and cannot be changed, so think of one that is secure and easy to remember. 6. Create a MyChart password. You can change your password at any time. 7. Enter your Password Reset Question and Answer. This can be used at a later time if you forget your password.  8. Enter your e-mail address. You will receive e-mail notification when new information is available in [facility]. 9. Click Sign Up. You can now view your medical record.   Additional Information Remember, MyChart is NOT to be used for urgent needs. For medical emergencies, dial 911.

## 2019-05-30 DIAGNOSIS — I10 Essential (primary) hypertension: Secondary | ICD-10-CM | POA: Diagnosis not present

## 2019-05-30 DIAGNOSIS — I4891 Unspecified atrial fibrillation: Secondary | ICD-10-CM | POA: Diagnosis not present

## 2019-05-30 DIAGNOSIS — E669 Obesity, unspecified: Secondary | ICD-10-CM | POA: Diagnosis not present

## 2019-05-30 DIAGNOSIS — Z6835 Body mass index (BMI) 35.0-35.9, adult: Secondary | ICD-10-CM | POA: Diagnosis not present

## 2019-06-08 IMAGING — DX DG HAND COMPLETE 3+V*L*
3 series · 3 of 3 positions shown · non-contrast
Comparison: None

CLINICAL DATA: Lacerations with a saw to the PIP joint of the index
finger and the distal aspect of the middle finger

EXAM:
LEFT HAND - COMPLETE 3+ VIEW

[hand ap]
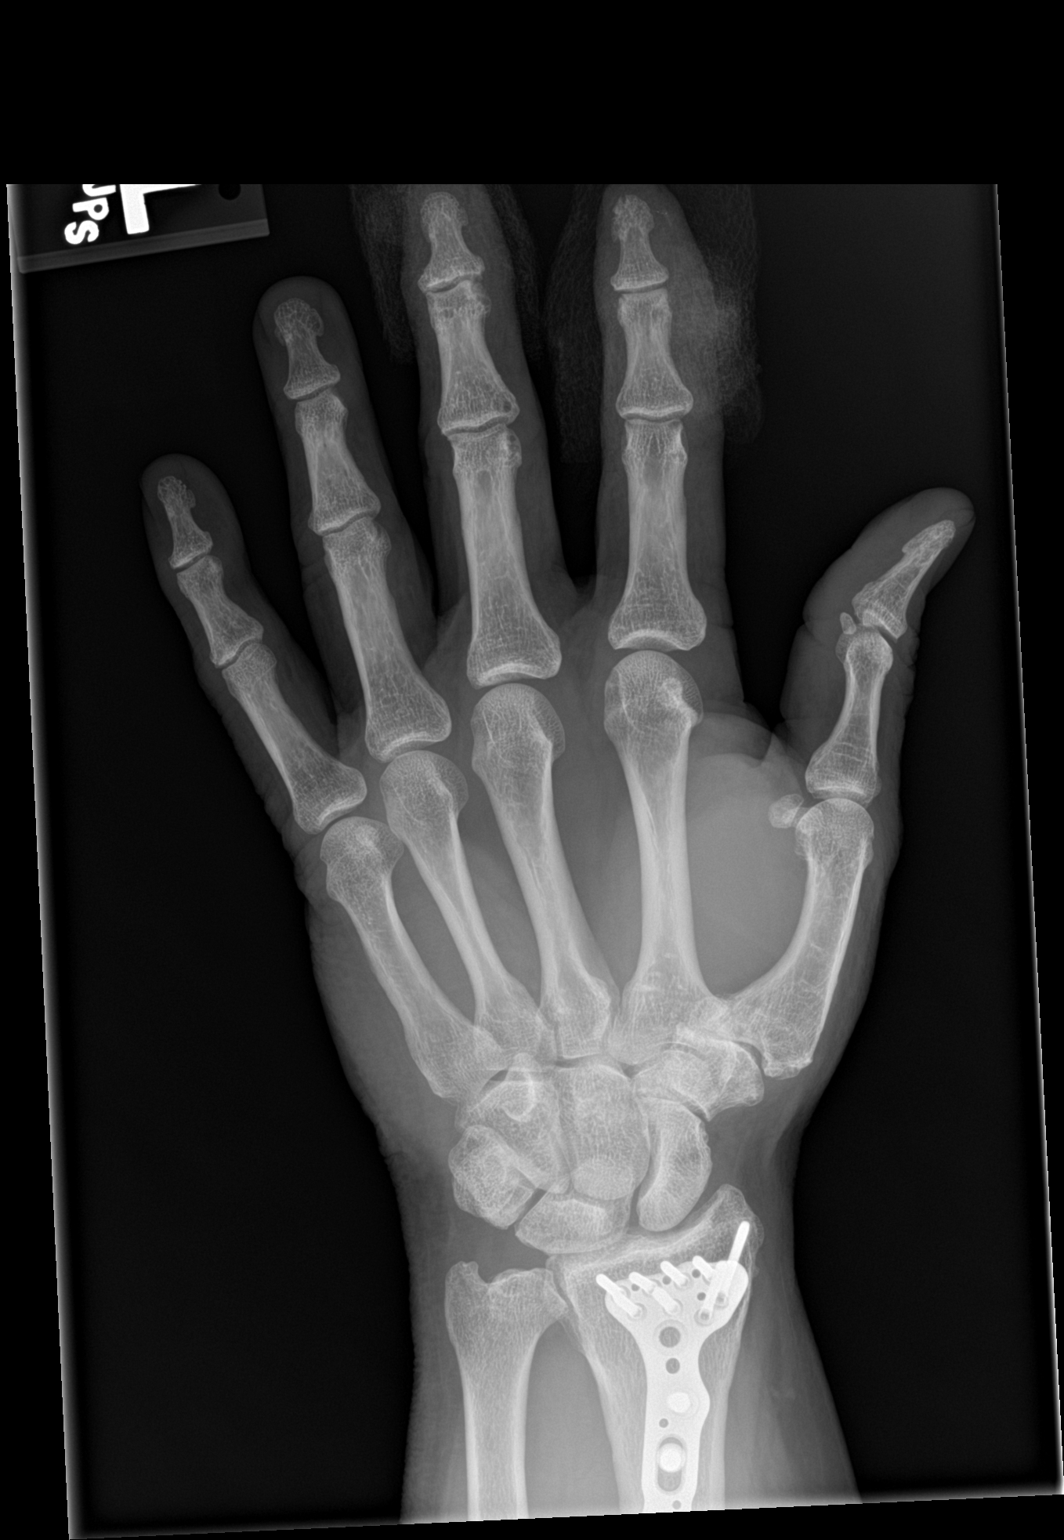

[hand obl]
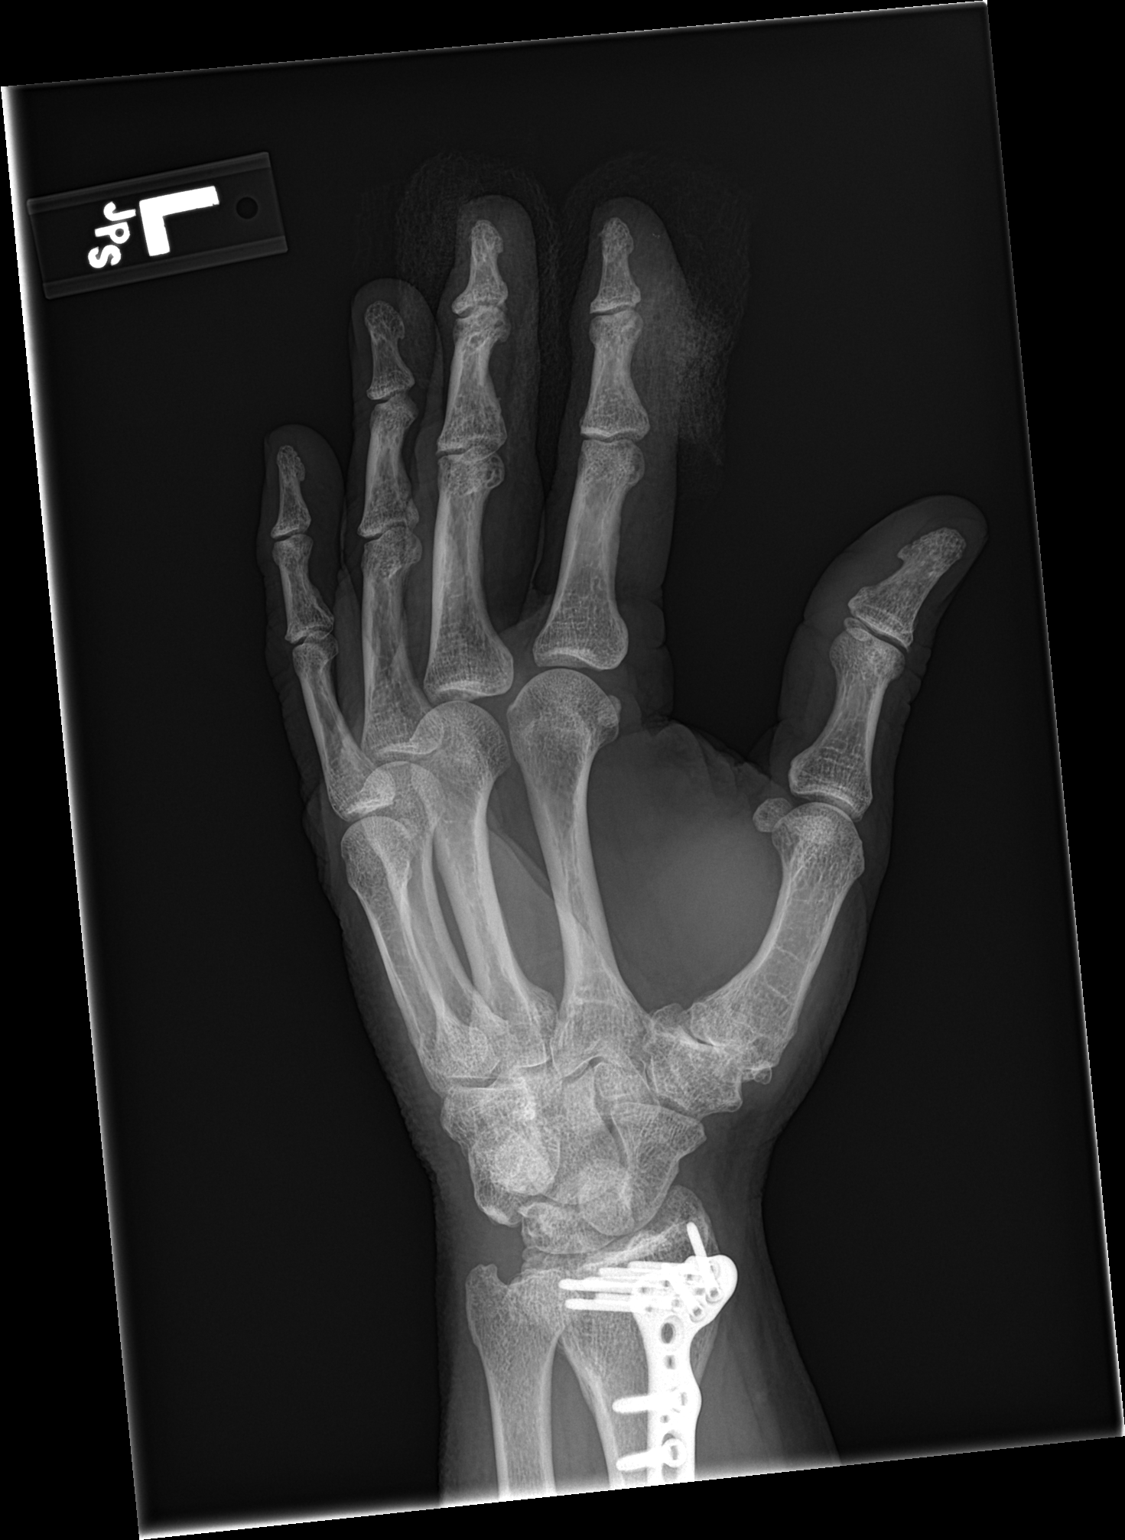

[hand lat]
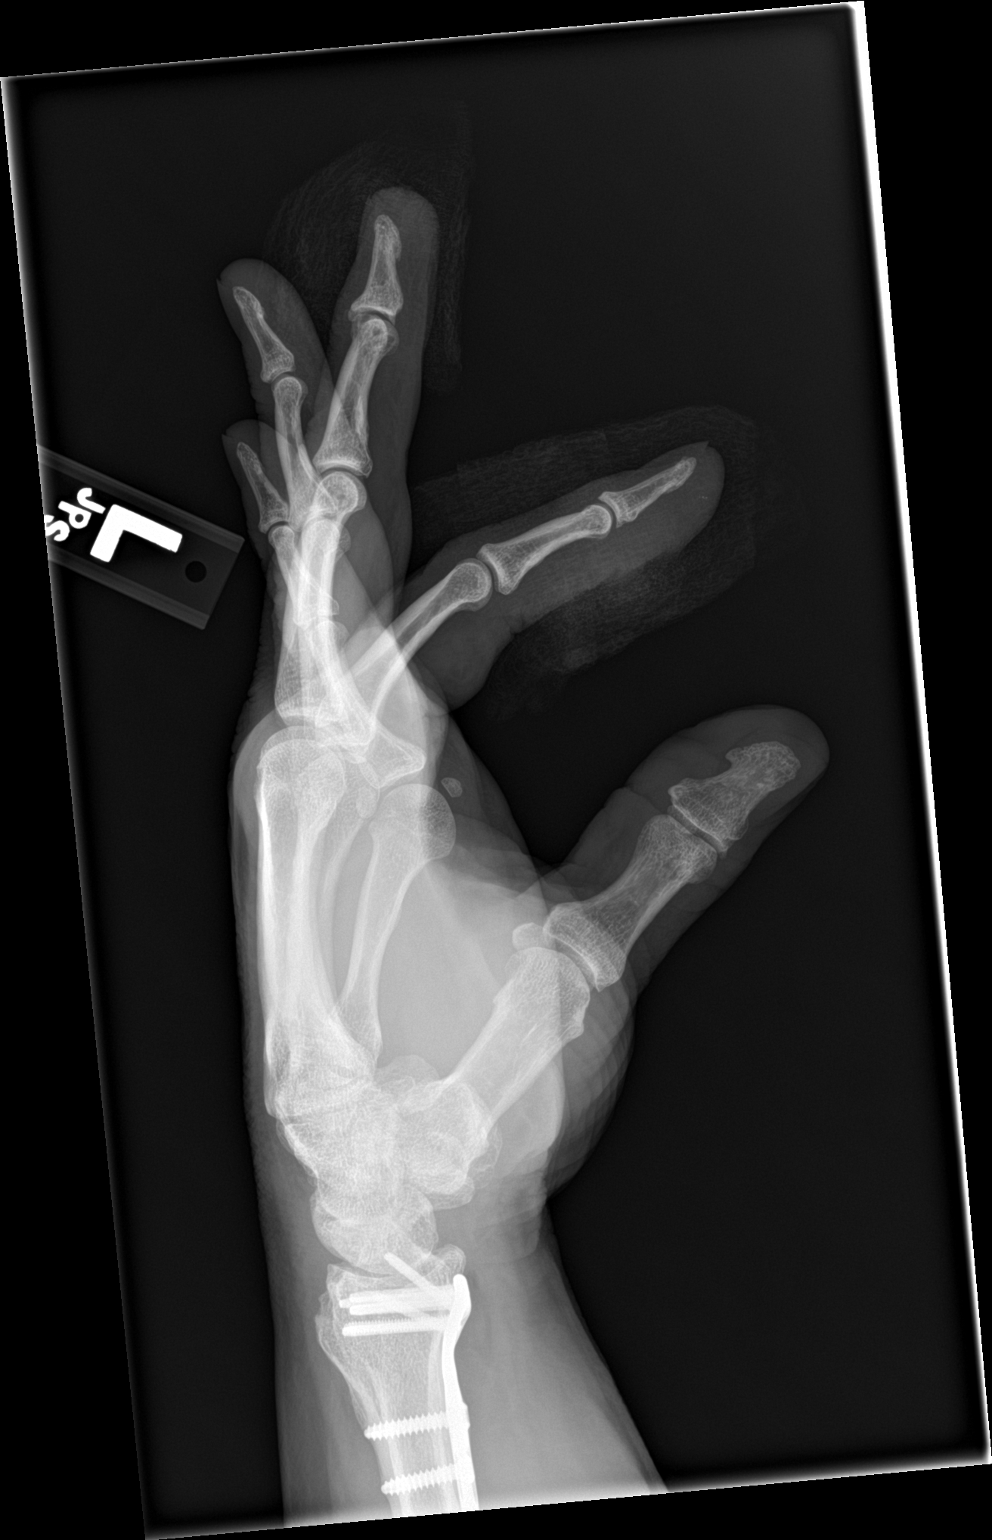

[3 of 3 positions shown; findings below may reference images not displayed]

FINDINGS: Dressing artifacts at distal aspects of LEFT index and middle
fingers.

Osseous mineralization normal.

Joint spaces preserved.

Tiny opacities are seen at the radial aspect of the distal phalanx
index finger consistent with tiny radiopaque foreign bodies.

No definite acute fracture, dislocation, or bone destruction.
IMPRESSION: No definite acute osseous abnormalities.

Tiny radiopaque foreign bodies at distal phalanx LEFT index finger.

## 2019-06-25 DIAGNOSIS — Z6834 Body mass index (BMI) 34.0-34.9, adult: Secondary | ICD-10-CM | POA: Diagnosis not present

## 2019-06-25 DIAGNOSIS — N529 Male erectile dysfunction, unspecified: Secondary | ICD-10-CM | POA: Diagnosis not present

## 2019-06-25 DIAGNOSIS — E785 Hyperlipidemia, unspecified: Secondary | ICD-10-CM | POA: Diagnosis not present

## 2019-06-25 DIAGNOSIS — I4891 Unspecified atrial fibrillation: Secondary | ICD-10-CM | POA: Diagnosis not present

## 2019-06-25 DIAGNOSIS — I10 Essential (primary) hypertension: Secondary | ICD-10-CM | POA: Diagnosis not present

## 2019-06-25 DIAGNOSIS — R739 Hyperglycemia, unspecified: Secondary | ICD-10-CM | POA: Diagnosis not present

## 2019-07-04 DIAGNOSIS — H40011 Open angle with borderline findings, low risk, right eye: Secondary | ICD-10-CM | POA: Diagnosis not present

## 2019-07-04 DIAGNOSIS — H401123 Primary open-angle glaucoma, left eye, severe stage: Secondary | ICD-10-CM | POA: Diagnosis not present

## 2019-08-01 DIAGNOSIS — H40051 Ocular hypertension, right eye: Secondary | ICD-10-CM | POA: Diagnosis not present

## 2019-08-01 DIAGNOSIS — H4052X3 Glaucoma secondary to other eye disorders, left eye, severe stage: Secondary | ICD-10-CM | POA: Diagnosis not present

## 2019-09-26 DIAGNOSIS — Z6834 Body mass index (BMI) 34.0-34.9, adult: Secondary | ICD-10-CM | POA: Diagnosis not present

## 2019-09-26 DIAGNOSIS — E785 Hyperlipidemia, unspecified: Secondary | ICD-10-CM | POA: Diagnosis not present

## 2019-09-26 DIAGNOSIS — R739 Hyperglycemia, unspecified: Secondary | ICD-10-CM | POA: Diagnosis not present

## 2019-09-26 DIAGNOSIS — Z125 Encounter for screening for malignant neoplasm of prostate: Secondary | ICD-10-CM | POA: Diagnosis not present

## 2019-09-26 DIAGNOSIS — I4891 Unspecified atrial fibrillation: Secondary | ICD-10-CM | POA: Diagnosis not present

## 2019-09-26 DIAGNOSIS — N529 Male erectile dysfunction, unspecified: Secondary | ICD-10-CM | POA: Diagnosis not present

## 2019-09-26 DIAGNOSIS — I10 Essential (primary) hypertension: Secondary | ICD-10-CM | POA: Diagnosis not present

## 2019-09-26 DIAGNOSIS — Z139 Encounter for screening, unspecified: Secondary | ICD-10-CM | POA: Diagnosis not present

## 2019-11-17 DIAGNOSIS — H6091 Unspecified otitis externa, right ear: Secondary | ICD-10-CM | POA: Diagnosis not present

## 2019-12-15 DIAGNOSIS — I480 Paroxysmal atrial fibrillation: Secondary | ICD-10-CM | POA: Insufficient documentation

## 2019-12-15 NOTE — Progress Notes (Signed)
Cardiology Office Note   Date:  12/16/2019   ID:  Taelon, Bendorf [birth year], MRN [id number]  PCP:  Ocie Doyne., MD (Inactive)  Cardiologist:   Minus Breeding, MD   Chief Complaint  Patient presents with  . Palpitations     History of Present Illness: Frank Chase is a 69 y.o. male who is referred by Ocie Doyne., MD (Inactive) for evaluation of atrial fib.  He is status post DCCV.  He did well from November on after his cardioversion.  He checked his rhythm not infrequently but it was always regular.  However, this past week he developed weakness and he thought palpitations and felt like it was out of rhythm.  He was working hard in [employer].  When he got back home he indeed was in fibrillation.  He just feels a little more SOB when climbing stairs when he is rhythms.  He feels a little weak.  He has not had any presyncope or syncope.  He denies any chest pressure, neck or arm discomfort.   Past Medical History:  Diagnosis Date  . Hypertension     Past Surgical History:  Procedure Laterality Date  . CARDIOVERSION N/A 05/12/2019   Procedure: CARDIOVERSION;  Surgeon: Acie Fredrickson Wonda Cheng, MD;  Location: [facility] ENDOSCOPY;  Service: Cardiovascular;  Laterality: N/A;  . WRIST SURGERY       Current Outpatient Medications  Medication Sig Dispense Refill  . amLODipine (NORVASC) 10 MG tablet Take 1 tablet (10 mg total) by mouth at bedtime. 90 tablet 3  . COSOPT 22.3-6.8 MG/ML ophthalmic solution Place 1 drop into the left eye 2 (two) times daily.    . Glucosamine-Chondroitin (COSAMIN DS PO) Take 2 tablets by mouth at bedtime.    . metoprolol succinate (TOPROL-XL) 100 MG 24 hr tablet Take 100 mg by mouth daily with supper. Take with or immediately following a meal.    . olmesartan-hydrochlorothiazide (BENICAR HCT) 40-12.5 MG tablet Take 1 tablet by mouth at bedtime.     . Travoprost, BAK Free, (TRAVATAN) 0.004 % SOLN ophthalmic solution Place 1 drop into both eyes at  bedtime.     Alveda Reasons 20 MG TABS tablet Take 20 mg by mouth at bedtime.      No current facility-administered medications for this visit.    Allergies:   Patient has no known allergies.     ROS:  Please see the history of present illness.   Otherwise, review of systems are positive for none.   All other systems are reviewed and negative.    PHYSICAL EXAM: VS:  BP 125/73   Pulse 68   Temp (!) 97.5 F (36.4 C)   Ht 5\' 10"  (1.778 m)   Wt 237 lb (107.5 kg)   SpO2 95%   BMI 34.01 kg/m  , BMI Body mass index is 34.01 kg/m. GENERAL:  Well appearing HEENT:  Pupils equal round and reactive, fundi not visualized, oral mucosa unremarkable NECK:  No jugular venous distention, waveform within normal limits, carotid upstroke brisk and symmetric, no bruits, no thyromegaly LYMPHATICS:  No cervical, inguinal adenopathy LUNGS:  Clear to auscultation bilaterally BACK:  No CVA tenderness CHEST:  Unremarkable HEART:  PMI not displaced or sustained,S1 and S2 within normal limits, no S3,  no clicks, no rubs, no murmurs, irregular  ABD:  Flat, positive bowel sounds normal in frequency in pitch, no bruits, no rebound, no guarding, no midline pulsatile mass, no hepatomegaly, no splenomegaly EXT:  2 plus pulses throughout, no edema, no cyanosis no clubbing SKIN:  No rashes no nodules NEURO:  Cranial nerves II through XII grossly intact, motor grossly intact throughout PSYCH:  Cognitively intact, oriented to person place and time   EKG:  EKG is   ordered today. Atrial fibrillation, rate 68, axis within normal limits, intervals within normal limits, no acute ST changes.   Recent Labs: 05/07/2019: BUN 13; Creatinine, Ser 1.05; Hemoglobin 16.6; Platelets 218; Potassium 3.9; Sodium 143    Lipid Panel No results found for: CHOL, TRIG, HDL, CHOLHDL, VLDL, LDLCALC, LDLDIRECT    Wt Readings from Last 3 Encounters:  12/16/19 237 lb (107.5 kg)  05/29/19 236 lb 6.4 oz (107.2 kg)  05/12/19 230 lb  (104.3 kg)      Other studies Reviewed: Additional studies/ records that were reviewed today include: Labs Review of the above records demonstrates:  Please see elsewhere in the note.     ASSESSMENT AND PLAN:  ATRIAL FIB:    Frank Chase has a CHA2DS2 - VASc score of 2.   He did maintain sinus rhythm for a while after cardioversion.  However, he is now back in fibrillation and he clearly is symptomatic with this.  I think we need to try harder to maintain sinus rhythm.  He has had a normal ejection fraction on echo last fall.  I Minna check a POET (Plain Old Exercise Treadmill) to screen for ischemic heart disease which I do not suspect.  If this is normal I will then start flecainide.  He is tolerating his anticoagulation.  He is up-to-date with blood work including normal electrolytes and TSH in April.  He has controlled blood pressure.  He does not have sleep apnea or other risk factors.  We talked about weight management to try to maintain sinus rhythm.  If after he is on the flecainide with slight up titration in his still in fibrillation I will will plan cardioversion.   HTN: Blood pressure is at target level.  No change in therapy.   COVID 19 EDUCATION:    He has had his vaccine.   Current medicines are reviewed at length with the patient today.  The patient does not have concerns regarding medicines.  The following changes have been made:  None  Labs/ tests ordered today include:   Orders Placed This Encounter  Procedures  . EXERCISE TOLERANCE TEST (ETT)  . EKG 12-Lead     Disposition:   FU with me in 2 months.     Signed, Minus Breeding, MD  12/16/2019 4:11 PM    [facility]

## 2019-12-16 ENCOUNTER — Ambulatory Visit: Payer: Medicare HMO | Admitting: Cardiology

## 2019-12-16 ENCOUNTER — Encounter: Payer: Self-pay | Admitting: Cardiology

## 2019-12-16 ENCOUNTER — Other Ambulatory Visit: Payer: Self-pay

## 2019-12-16 VITALS — BP 125/73 | HR 68 | Temp 97.5°F | Ht 70.0 in | Wt 237.0 lb

## 2019-12-16 DIAGNOSIS — Z7189 Other specified counseling: Secondary | ICD-10-CM

## 2019-12-16 DIAGNOSIS — I480 Paroxysmal atrial fibrillation: Secondary | ICD-10-CM | POA: Diagnosis not present

## 2019-12-16 DIAGNOSIS — I10 Essential (primary) hypertension: Secondary | ICD-10-CM | POA: Diagnosis not present

## 2019-12-16 NOTE — Patient Instructions (Addendum)
Medication Instructions:  NO CHANGES  *If you need a refill on your cardiac medications before your next appointment, please call your pharmacy*   Lab Work: NOT NEEDED If you have labs (blood work) drawn today and your tests are completely normal, you will receive your results only by: Marland Kitchen MyChart Message (if you have MyChart) OR . A paper copy in the mail If you have any lab test that is abnormal or we need to change your treatment, we will call you to review the results.   Testing/Procedures: WILL BE SCHEDULE AT [place] Your physician has requested that you have an exercise tolerance test. For further information please visit [email or web address]. Please also follow instruction sheet, as given.  PLEASE DO NOT TAKE YOUR METOPROLOL THE NIGHT BEFORE OF THE TEST.   Follow-Up: At [facility], you and your health needs are our priority.  As part of our continuing mission to provide you with exceptional heart care, we have created designated Provider Care Teams.  These Care Teams include your primary Cardiologist (physician) and Advanced Practice Providers (APPs -  Physician Assistants and Nurse Practitioners) who all work together to provide you with the care you need, when you need it.    Your next appointment:   2 month(s)  The format for your next appointment:   In Person  Provider:   Minus Breeding, MD

## 2019-12-17 ENCOUNTER — Telehealth (HOSPITAL_COMMUNITY): Payer: Self-pay

## 2019-12-17 NOTE — Telephone Encounter (Signed)
Encounter complete. 

## 2019-12-19 ENCOUNTER — Other Ambulatory Visit: Payer: Self-pay

## 2019-12-19 ENCOUNTER — Ambulatory Visit (HOSPITAL_COMMUNITY)
Admission: RE | Admit: 2019-12-19 | Discharge: 2019-12-19 | Disposition: A | Discharge status: Home / Self Care | Payer: Medicare HMO | Source: Ambulatory Visit | Attending: Cardiology | Admitting: Cardiology

## 2019-12-19 DIAGNOSIS — I10 Essential (primary) hypertension: Secondary | ICD-10-CM | POA: Insufficient documentation

## 2019-12-19 DIAGNOSIS — I480 Paroxysmal atrial fibrillation: Secondary | ICD-10-CM | POA: Diagnosis not present

## 2019-12-19 LAB — EXERCISE TOLERANCE TEST
Estimated workload: 7.6 METS
Exercise duration (min): 6 min
Exercise duration (sec): 26 s
MPHR: 151 {beats}/min
Peak HR: 146 {beats}/min
Percent HR: 96 %
Rest HR: 65 {beats}/min

## 2019-12-23 ENCOUNTER — Other Ambulatory Visit: Payer: Self-pay

## 2019-12-23 DIAGNOSIS — I480 Paroxysmal atrial fibrillation: Secondary | ICD-10-CM

## 2019-12-24 ENCOUNTER — Telehealth (HOSPITAL_COMMUNITY): Payer: Self-pay

## 2019-12-24 ENCOUNTER — Telehealth: Payer: Self-pay | Admitting: Cardiology

## 2019-12-24 NOTE — Telephone Encounter (Signed)
Encounter complete. 

## 2019-12-24 NOTE — Telephone Encounter (Signed)
Spoke with patient to schedule Frank Chase ordered by Dr. Ival Bible Friday 12/26/19 at 7:45 am at [facility]---patient to arrive at 7:30 am for check in---patient voiced his understanding.

## 2019-12-24 NOTE — Telephone Encounter (Signed)
-----   Message from Caprice Beaver, LPN sent at 11/25/4096  2:45 PM EDT ----- Called patient, gave results.  Ordered LEXI-  Will notify schedulers to call and get scheduled ASAP per Dr.Hochrein.

## 2019-12-26 ENCOUNTER — Other Ambulatory Visit: Payer: Self-pay

## 2019-12-26 ENCOUNTER — Ambulatory Visit (HOSPITAL_COMMUNITY)
Admission: RE | Admit: 2019-12-26 | Discharge: 2019-12-26 | Disposition: A | Discharge status: Home / Self Care | Payer: Medicare HMO | Source: Ambulatory Visit | Attending: Cardiovascular Disease | Admitting: Cardiovascular Disease

## 2019-12-26 DIAGNOSIS — I480 Paroxysmal atrial fibrillation: Secondary | ICD-10-CM | POA: Diagnosis not present

## 2019-12-26 LAB — MYOCARDIAL PERFUSION IMAGING
LV dias vol: 119 mL (ref 62–150)
LV sys vol: 61 mL
Peak HR: 79 {beats}/min
Rest HR: 68 {beats}/min
SDS: 2
SRS: 0
SSS: 2
TID: 1.15

## 2019-12-26 MED ORDER — AMINOPHYLLINE 25 MG/ML IV SOLN
75.0000 mg | Freq: Once | INTRAVENOUS | Status: AC
Start: 2019-12-26 — End: 2019-12-26
  Administered 2019-12-26: 75 mg via INTRAVENOUS

## 2019-12-26 MED ORDER — TECHNETIUM TC 99M TETROFOSMIN IV KIT
29.9000 | PACK | Freq: Once | INTRAVENOUS | Status: AC | PRN
  Administered 2019-12-26: 29.9 via INTRAVENOUS
  Filled 2019-12-26: qty 30

## 2019-12-26 MED ORDER — TECHNETIUM TC 99M TETROFOSMIN IV KIT
10.9000 | PACK | Freq: Once | INTRAVENOUS | Status: AC | PRN
  Administered 2019-12-26: 10.9 via INTRAVENOUS
  Filled 2019-12-26: qty 11

## 2019-12-26 MED ORDER — REGADENOSON 0.4 MG/5ML IV SOLN
0.4000 mg | Freq: Once | INTRAVENOUS | Status: AC
  Administered 2019-12-26: 0.4 mg via INTRAVENOUS

## 2019-12-31 DIAGNOSIS — Z1331 Encounter for screening for depression: Secondary | ICD-10-CM | POA: Diagnosis not present

## 2019-12-31 DIAGNOSIS — N529 Male erectile dysfunction, unspecified: Secondary | ICD-10-CM | POA: Diagnosis not present

## 2019-12-31 DIAGNOSIS — E785 Hyperlipidemia, unspecified: Secondary | ICD-10-CM | POA: Diagnosis not present

## 2019-12-31 DIAGNOSIS — I4891 Unspecified atrial fibrillation: Secondary | ICD-10-CM | POA: Diagnosis not present

## 2019-12-31 DIAGNOSIS — Z6834 Body mass index (BMI) 34.0-34.9, adult: Secondary | ICD-10-CM | POA: Diagnosis not present

## 2019-12-31 DIAGNOSIS — R739 Hyperglycemia, unspecified: Secondary | ICD-10-CM | POA: Diagnosis not present

## 2019-12-31 DIAGNOSIS — Z9181 History of falling: Secondary | ICD-10-CM | POA: Diagnosis not present

## 2019-12-31 DIAGNOSIS — I10 Essential (primary) hypertension: Secondary | ICD-10-CM | POA: Diagnosis not present

## 2020-01-01 ENCOUNTER — Telehealth: Payer: Self-pay | Admitting: Pharmacist

## 2020-01-01 NOTE — Telephone Encounter (Signed)
Medication list reviewed in anticipation of upcoming Tikosyn initiation. Patient is taking HCTZ in his combo BP pill which will need to be discontinued and changed to just olmesartan.  Patient is anticoagulated on Xarelto 20mg  daily on the appropriate dose. Please ensure that patient has not missed any anticoagulation doses in the 3 weeks prior to Tikosyn initiation.   Would also confirm that pt has been taking his Xarelto with supper/his largest meal of the day. His rx states to take at bedtime but it should be taken with largest meal of the day to be fully absorbed and effective.  Will also need updated Mg and K - K from last November was < 4 and may need to be repleted.  Patient will need to be counseled to avoid use of Benadryl while on Tikosyn and in the 2-3 days prior to Tikosyn initiation.

## 2020-01-02 ENCOUNTER — Other Ambulatory Visit: Payer: Self-pay

## 2020-01-02 ENCOUNTER — Encounter (HOSPITAL_COMMUNITY): Payer: Self-pay | Admitting: Physician Assistant

## 2020-01-02 ENCOUNTER — Ambulatory Visit (HOSPITAL_COMMUNITY)
Admission: RE | Admit: 2020-01-02 | Discharge: 2020-01-02 | Disposition: A | Discharge status: Home / Self Care | Payer: Medicare HMO | Source: Ambulatory Visit | Attending: Physician Assistant | Admitting: Physician Assistant

## 2020-01-02 ENCOUNTER — Other Ambulatory Visit (HOSPITAL_COMMUNITY)
Admission: RE | Admit: 2020-01-02 | Discharge: 2020-01-02 | Disposition: A | Discharge status: Home / Self Care | Payer: Medicare HMO | Source: Ambulatory Visit | Attending: Cardiology | Admitting: Cardiology

## 2020-01-02 ENCOUNTER — Other Ambulatory Visit (HOSPITAL_COMMUNITY): Payer: Self-pay | Admitting: *Deleted

## 2020-01-02 VITALS — BP 112/60 | HR 64 | Ht 70.0 in | Wt 234.8 lb

## 2020-01-02 DIAGNOSIS — D6869 Other thrombophilia: Secondary | ICD-10-CM | POA: Diagnosis not present

## 2020-01-02 DIAGNOSIS — E669 Obesity, unspecified: Secondary | ICD-10-CM | POA: Diagnosis not present

## 2020-01-02 DIAGNOSIS — I4819 Other persistent atrial fibrillation: Secondary | ICD-10-CM | POA: Insufficient documentation

## 2020-01-02 DIAGNOSIS — Z20822 Contact with and (suspected) exposure to covid-19: Secondary | ICD-10-CM | POA: Diagnosis not present

## 2020-01-02 DIAGNOSIS — Z7984 Long term (current) use of oral hypoglycemic drugs: Secondary | ICD-10-CM | POA: Insufficient documentation

## 2020-01-02 DIAGNOSIS — Z79899 Other long term (current) drug therapy: Secondary | ICD-10-CM | POA: Insufficient documentation

## 2020-01-02 DIAGNOSIS — Z6833 Body mass index (BMI) 33.0-33.9, adult: Secondary | ICD-10-CM | POA: Diagnosis not present

## 2020-01-02 DIAGNOSIS — E119 Type 2 diabetes mellitus without complications: Secondary | ICD-10-CM | POA: Insufficient documentation

## 2020-01-02 DIAGNOSIS — Z87891 Personal history of nicotine dependence: Secondary | ICD-10-CM | POA: Diagnosis not present

## 2020-01-02 DIAGNOSIS — I10 Essential (primary) hypertension: Secondary | ICD-10-CM | POA: Diagnosis not present

## 2020-01-02 DIAGNOSIS — Z7901 Long term (current) use of anticoagulants: Secondary | ICD-10-CM | POA: Diagnosis not present

## 2020-01-02 DIAGNOSIS — Z713 Dietary counseling and surveillance: Secondary | ICD-10-CM | POA: Diagnosis not present

## 2020-01-02 LAB — BASIC METABOLIC PANEL WITH GFR
Anion gap: 9 (ref 5–15)
BUN: 15 mg/dL (ref 8–23)
CO2: 25 mmol/L (ref 22–32)
Calcium: 8.9 mg/dL (ref 8.9–10.3)
Chloride: 105 mmol/L (ref 98–111)
Creatinine, Ser: 1.23 mg/dL (ref 0.61–1.24)
GFR calc Af Amer: >60 mL/min
GFR calc non Af Amer: 59 mL/min — ABNORMAL LOW
Glucose, Bld: 172 mg/dL — ABNORMAL HIGH (ref 70–99)
Potassium: 3.6 mmol/L (ref 3.5–5.1)
Sodium: 139 mmol/L (ref 135–145)

## 2020-01-02 LAB — SARS CORONAVIRUS 2 (TAT 6-24 HRS): SARS Coronavirus 2: NEGATIVE

## 2020-01-02 LAB — MAGNESIUM: Magnesium: 2 mg/dL (ref 1.7–2.4)

## 2020-01-02 MED ORDER — OLMESARTAN MEDOXOMIL 40 MG PO TABS
40.0000 mg | ORAL_TABLET | Freq: Every day | ORAL | 6 refills | Status: DC
Start: 2020-01-02 — End: 2020-01-16

## 2020-01-02 MED ORDER — POTASSIUM CHLORIDE ER 10 MEQ PO TBCR
10.0000 meq | EXTENDED_RELEASE_TABLET | Freq: Every day | ORAL | 3 refills | Status: DC
Start: 2020-01-02 — End: 2020-01-16

## 2020-01-02 NOTE — Patient Instructions (Signed)
Stop benicar/hctz  Start Benicar (Olmesartan) 40mg  once a day as of tomorrow

## 2020-01-02 NOTE — Progress Notes (Signed)
Primary Care Physician: Ocie Doyne., MD (Inactive) Primary Cardiologist: Dr Percival Spanish  Primary Electrophysiologist: none Referring Physician: Dr Olga Millers Frank Chase is a 69 y.o. male with a history of HTN, DM and persistent atrial fibrillation who presents for consultation in the [facility]. The patient was initially diagnosed with atrial fibrillation 04/2019 incidentally by the patient's wife who noted an irregular heart beat. He presented to his PCP who confirmed afib on ECG. He was started on Xarelto and underwent DCCV on 05/12/19. Patient did well until follow up 11/2019 when he noted weakness and SOB with exertion and ECG showed he was back in afib. Patient is on Xarelto for a CHADS2VASC score of 3. Dr Percival Spanish recommended dofetilide at that time. Patient remains in rate controlled afib today with symptoms of weakness and intermittent dizziness. He denies any missed doses of anticoagulation in the last 3 weeks.   Today, he denies symptoms of palpitations, chest pain, shortness of breath, orthopnea, PND, lower extremity edema, presyncope, syncope, snoring, daytime somnolence, bleeding, or neurologic sequela. The patient is tolerating medications without difficulties and is otherwise without complaint today.    Atrial Fibrillation Risk Factors:  he does not have symptoms or diagnosis of sleep apnea. he does not have a history of rheumatic fever.   he has a BMI of Body mass index is 33.69 kg/m.Marland Kitchen Filed Weights   01/02/20 0846  Weight: 106.5 kg    Family History  Problem Relation Age of Onset   Alzheimer's disease Mother      Atrial Fibrillation Management history:  Previous antiarrhythmic drugs: none Previous cardioversions: 05/12/19 Previous ablations: none CHADS2VASC score: 3 Anticoagulation history: Xarelto   Past Medical History:  Diagnosis Date   Hypertension    Past Surgical History:  Procedure Laterality Date    CARDIOVERSION N/A 05/12/2019   Procedure: CARDIOVERSION;  Surgeon: Thayer Headings, MD;  Location: [facility] ENDOSCOPY;  Service: Cardiovascular;  Laterality: N/A;   WRIST SURGERY      Current Outpatient Medications  Medication Sig Dispense Refill   amLODipine (NORVASC) 10 MG tablet Take 1 tablet (10 mg total) by mouth at bedtime. 90 tablet 3   COSOPT 22.3-6.8 MG/ML ophthalmic solution Place 1 drop into the left eye 2 (two) times daily.     Glucosamine-Chondroitin (COSAMIN DS PO) Take 2 tablets by mouth at bedtime.     metFORMIN (GLUCOPHAGE) 500 MG tablet Take by mouth 2 (two) times daily with a meal.     metoprolol succinate (TOPROL-XL) 100 MG 24 hr tablet Take 100 mg by mouth daily with supper. Take with or immediately following a meal.     Travoprost, BAK Free, (TRAVATAN) 0.004 % SOLN ophthalmic solution Place 1 drop into both eyes at bedtime.      XARELTO 20 MG TABS tablet Take 20 mg by mouth at bedtime.      olmesartan (BENICAR) 40 MG tablet Take 1 tablet (40 mg total) by mouth daily. 30 tablet 6   No current facility-administered medications for this encounter.    No Known Allergies  Social History   Socioeconomic History   Marital status: Married    Spouse name: Not on file   Number of children: Not on file   Years of education: Not on file   Highest education level: Not on file  Occupational History   Not on file  Tobacco Use   Smoking status: Former Smoker    Types: Cigarettes    Quit date:  1978    Years since quitting: 43.5   Smokeless tobacco: Former Systems developer    Quit date: 1985  Substance and Sexual Activity   Alcohol use: Yes    Comment: social   Drug use: Never   Sexual activity: Not on file  Other Topics Concern   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     Social Determinants of Health   Financial Resource Strain:    Difficulty of Paying Living Expenses:   Food Insecurity:     Worried About Charity fundraiser in the Last Year:    Arboriculturist in the Last Year:   Transportation Needs:    Film/video editor (Medical):    Lack of Transportation (Non-Medical):   Physical Activity:    Days of Exercise per Week:    Minutes of Exercise per Session:   Stress:    Feeling of Stress :   Social Connections:    Frequency of Communication with Friends and Family:    Frequency of Social Gatherings with Friends and Family:    Attends Religious Services:    Active Member of Clubs or Organizations:    Attends Music therapist:    Marital Status:   Intimate Partner Violence:    Fear of Current or Ex-Partner:    Emotionally Abused:    Physically Abused:    Sexually Abused:      ROS- All systems are reviewed and negative except as per the HPI above.  Physical Exam: Vitals:   01/02/20 0846  BP: 112/60  Pulse: 64  Weight: 106.5 kg  Height: 5\' 10"  (1.778 m)    GEN- The patient is well appearing obese male, alert and oriented x 3 today.   Head- normocephalic, atraumatic Eyes-  Sclera clear, conjunctiva pink Ears- hearing intact Oropharynx- clear Neck- supple  Lungs- Clear to ausculation bilaterally, normal work of breathing Heart- irregular rate and rhythm, no murmurs, rubs or gallops  GI- soft, NT, ND, + BS Extremities- no clubbing, cyanosis, or edema MS- no significant deformity or atrophy Skin- no rash or lesion Psych- euthymic mood, full affect Neuro- strength and sensation are intact  Wt Readings from Last 3 Encounters:  01/02/20 106.5 kg  12/26/19 107.5 kg  12/16/19 107.5 kg    EKG today demonstrates afib HR 64, QRS 96, QTc 398  Echo 04/17/19 demonstrated  Severely dilated LV, EF 55-60%, normal LA size  Epic records are reviewed at length today  CHA2DS2-VASc Score = 3  The patient's score is based upon: CHF History: 0 HTN History: 1 Age : 1 Diabetes History: 1 Stroke History: 0 Vascular Disease  History: 0 Gender: 0      ASSESSMENT AND PLAN: 1. Persistent Atrial Fibrillation (ICD10:  I48.19) The patient's CHA2DS2-VASc score is 3, indicating a 3.2% annual risk of stroke.   We discussed therapeutic options today including dofetilide. PharmD has screened drugs and HCTZ will need to be d/c'd 3 days prior to admission. QTc in SR 385 ms Check bmet/mag today. Continue Xarelto 20 mg daily  2. Secondary Hypercoagulable State (ICD10:  D68.69) The patient is at significant risk for stroke/thromboembolism based upon his CHA2DS2-VASc Score of 3.  Continue Rivaroxaban (Xarelto).   3. Obesity Body mass index is 33.69 kg/m. Lifestyle modification was discussed at length including regular exercise and weight reduction.  4. HTN Start olmesartan 40 mg daily. Stop Benicar HCT today.   Follow  up next week for dofetilide admission.    [facility] [address] [place], [place] [address] [phone] 01/02/2020 9:19 AM

## 2020-01-06 ENCOUNTER — Encounter (HOSPITAL_COMMUNITY): Payer: Self-pay | Admitting: Physician Assistant

## 2020-01-06 ENCOUNTER — Ambulatory Visit (HOSPITAL_COMMUNITY): Payer: Medicare HMO | Admitting: Nurse Practitioner

## 2020-01-06 ENCOUNTER — Ambulatory Visit (HOSPITAL_COMMUNITY)
Admission: RE | Admit: 2020-01-06 | Discharge: 2020-01-06 | Disposition: A | Discharge status: Home / Self Care | Payer: Medicare HMO | Source: Ambulatory Visit | Attending: Nurse Practitioner | Admitting: Nurse Practitioner

## 2020-01-06 ENCOUNTER — Inpatient Hospital Stay (HOSPITAL_COMMUNITY)
Admission: RE | Admit: 2020-01-06 | Discharge: 2020-01-09 | DRG: 309 | Disposition: A | Discharge status: Home / Self Care | Payer: Medicare HMO | Source: Ambulatory Visit | Attending: Cardiology | Admitting: Cardiology

## 2020-01-06 ENCOUNTER — Encounter (HOSPITAL_COMMUNITY): Payer: Self-pay | Admitting: Cardiology

## 2020-01-06 ENCOUNTER — Other Ambulatory Visit: Payer: Self-pay

## 2020-01-06 VITALS — BP 118/70 | HR 66 | Ht 70.0 in | Wt 235.0 lb

## 2020-01-06 DIAGNOSIS — Z6833 Body mass index (BMI) 33.0-33.9, adult: Secondary | ICD-10-CM | POA: Diagnosis not present

## 2020-01-06 DIAGNOSIS — I4819 Other persistent atrial fibrillation: Secondary | ICD-10-CM

## 2020-01-06 DIAGNOSIS — Z820 Family history of epilepsy and other diseases of the nervous system: Secondary | ICD-10-CM | POA: Diagnosis not present

## 2020-01-06 DIAGNOSIS — Z7901 Long term (current) use of anticoagulants: Secondary | ICD-10-CM | POA: Diagnosis not present

## 2020-01-06 DIAGNOSIS — I10 Essential (primary) hypertension: Secondary | ICD-10-CM | POA: Diagnosis not present

## 2020-01-06 DIAGNOSIS — Z79899 Other long term (current) drug therapy: Secondary | ICD-10-CM

## 2020-01-06 DIAGNOSIS — E669 Obesity, unspecified: Secondary | ICD-10-CM | POA: Diagnosis not present

## 2020-01-06 DIAGNOSIS — D6869 Other thrombophilia: Secondary | ICD-10-CM | POA: Diagnosis not present

## 2020-01-06 DIAGNOSIS — E119 Type 2 diabetes mellitus without complications: Secondary | ICD-10-CM | POA: Diagnosis not present

## 2020-01-06 DIAGNOSIS — Z87891 Personal history of nicotine dependence: Secondary | ICD-10-CM

## 2020-01-06 HISTORY — DX: Type 2 diabetes mellitus without complications: E11.9

## 2020-01-06 HISTORY — DX: Unspecified atrial fibrillation: I48.91

## 2020-01-06 LAB — BASIC METABOLIC PANEL
Anion gap: 10 (ref 5–15)
BUN: 14 mg/dL (ref 8–23)
CO2: 23 mmol/L (ref 22–32)
Calcium: 9.1 mg/dL (ref 8.9–10.3)
Chloride: 103 mmol/L (ref 98–111)
Creatinine, Ser: 1.12 mg/dL (ref 0.61–1.24)
GFR calc Af Amer: >60 mL/min (ref >60)
GFR calc non Af Amer: >60 mL/min (ref >60)
Glucose, Bld: 100 mg/dL — ABNORMAL HIGH (ref 70–99)
Potassium: 4.2 mmol/L (ref 3.5–5.1)
Sodium: 136 mmol/L (ref 135–145)

## 2020-01-06 LAB — MAGNESIUM: Magnesium: 2 mg/dL (ref 1.7–2.4)

## 2020-01-06 MED ORDER — METOPROLOL SUCCINATE ER 100 MG PO TB24
100.0000 mg | ORAL_TABLET | Freq: Every day | ORAL | Status: DC
  Administered 2020-01-06 – 2020-01-07 (×2): 100 mg via ORAL
  Filled 2020-01-06 (×2): qty 1

## 2020-01-06 MED ORDER — LATANOPROST 0.005 % OP SOLN
1.0000 [drp] | Freq: Every day | OPHTHALMIC | Status: DC
  Administered 2020-01-06 – 2020-01-08 (×3): 1 [drp] via OPHTHALMIC
  Filled 2020-01-06: qty 2.5

## 2020-01-06 MED ORDER — DORZOLAMIDE HCL-TIMOLOL MAL 2-0.5 % OP SOLN
1.0000 [drp] | Freq: Two times a day (BID) | OPHTHALMIC | Status: DC
  Administered 2020-01-06 – 2020-01-09 (×6): 1 [drp] via OPHTHALMIC
  Filled 2020-01-06: qty 10

## 2020-01-06 MED ORDER — AMLODIPINE BESYLATE 10 MG PO TABS
10.0000 mg | ORAL_TABLET | Freq: Every day | ORAL | Status: DC
  Administered 2020-01-06 – 2020-01-08 (×3): 10 mg via ORAL
  Filled 2020-01-06 (×3): qty 1

## 2020-01-06 MED ORDER — SODIUM CHLORIDE 0.9 % IV SOLN
250.0000 mL | INTRAVENOUS | Status: DC | PRN
  Administered 2020-01-08: 250 mL via INTRAVENOUS

## 2020-01-06 MED ORDER — IRBESARTAN 150 MG PO TABS
300.0000 mg | ORAL_TABLET | Freq: Every day | ORAL | Status: DC
  Administered 2020-01-06 – 2020-01-08 (×3): 300 mg via ORAL
  Filled 2020-01-06 (×3): qty 2

## 2020-01-06 MED ORDER — MAGNESIUM SULFATE 2 GM/50ML IV SOLN
2.0000 g | Freq: Once | INTRAVENOUS | Status: AC
  Administered 2020-01-06: 2 g via INTRAVENOUS
  Filled 2020-01-06: qty 50

## 2020-01-06 MED ORDER — POTASSIUM CHLORIDE CRYS ER 10 MEQ PO TBCR
10.0000 meq | EXTENDED_RELEASE_TABLET | Freq: Every day | ORAL | Status: DC
  Administered 2020-01-07 – 2020-01-09 (×3): 10 meq via ORAL
  Filled 2020-01-06 (×4): qty 1

## 2020-01-06 MED ORDER — METFORMIN HCL 500 MG PO TABS
500.0000 mg | ORAL_TABLET | Freq: Two times a day (BID) | ORAL | Status: DC
  Administered 2020-01-06 – 2020-01-09 (×5): 500 mg via ORAL
  Filled 2020-01-06 (×5): qty 1

## 2020-01-06 MED ORDER — SODIUM CHLORIDE 0.9% FLUSH: 3.0000 mL | INTRAVENOUS | Status: DC | PRN

## 2020-01-06 MED ORDER — DOFETILIDE 500 MCG PO CAPS
500.0000 ug | ORAL_CAPSULE | Freq: Two times a day (BID) | ORAL | Status: DC
  Administered 2020-01-06 – 2020-01-09 (×6): 500 ug via ORAL
  Filled 2020-01-06 (×6): qty 1

## 2020-01-06 MED ORDER — SODIUM CHLORIDE 0.9% FLUSH
3.0000 mL | Freq: Two times a day (BID) | INTRAVENOUS | Status: DC
  Administered 2020-01-06 – 2020-01-08 (×5): 3 mL via INTRAVENOUS

## 2020-01-06 MED ORDER — RIVAROXABAN 20 MG PO TABS
20.0000 mg | ORAL_TABLET | Freq: Every day | ORAL | Status: DC
  Administered 2020-01-06 – 2020-01-08 (×3): 20 mg via ORAL
  Filled 2020-01-06 (×3): qty 1

## 2020-01-06 NOTE — H&P (Signed)
Primary Care Physician: Ocie Doyne., MD (Inactive) Primary Cardiologist: Dr Percival Spanish  Primary Electrophysiologist: none Referring Physician: Dr Olga Millers Frank Chase is a 69 y.o. male with a history of HTN, DM and persistent atrial fibrillation who presents for follow up in the [facility]. The patient was initially diagnosed with atrial fibrillation 04/2019 incidentally by the patient's wife who noted an irregular heart beat. He presented to his PCP who confirmed afib on ECG. He was started on Xarelto and underwent DCCV on 05/12/19. Patient did well until follow up 11/2019 when he noted weakness and SOB with exertion and ECG showed he was back in afib. Patient is on Xarelto for a CHADS2VASC score of 3. Dr Percival Spanish recommended dofetilide at that time. He denies any missed doses of anticoagulation in the last 3 weeks.   On follow up today, patient presents for dofetilide admission. He stopped HCTZ 01/02/20. He is in rate controlled afib today with symptoms of weakness and SOB. He denies any missed doses of anticoagulation in the last 3 weeks.  Today, he denies symptoms of palpitations, chest pain, orthopnea, PND, lower extremity edema, presyncope, syncope, snoring, daytime somnolence, bleeding, or neurologic sequela. The patient is tolerating medications without difficulties and is otherwise without complaint today.    Atrial Fibrillation Risk Factors:  he does not have symptoms or diagnosis of sleep apnea. he does not have a history of rheumatic fever.   he has a BMI of There is no height or weight on file to calculate BMI.. There were no vitals filed for this visit.  Family History  Problem Relation Age of Onset  . Alzheimer's disease Mother      Atrial Fibrillation Management history:  Previous antiarrhythmic drugs: none Previous cardioversions: 05/12/19 Previous ablations: none CHADS2VASC score: 3 Anticoagulation history: Xarelto   Past  Medical History:  Diagnosis Date  . Atrial fibrillation ([place])   . Diabetes mellitus without complication ([place])   . Hypertension    Past Surgical History:  Procedure Laterality Date  . CARDIOVERSION N/A 05/12/2019   Procedure: CARDIOVERSION;  Surgeon: Acie Fredrickson Wonda Cheng, MD;  Location: [facility] ENDOSCOPY;  Service: Cardiovascular;  Laterality: N/A;  . WRIST SURGERY      Current Facility-Administered Medications  Medication Dose Route Frequency Provider Last Rate Last Admin  . 0.9 %  sodium chloride infusion  250 mL Intravenous PRN Fenton, Clint R, PA      . amLODipine (NORVASC) tablet 10 mg  10 mg Oral QHS Fenton, Clint R, PA      . dofetilide (TIKOSYN) capsule 500 mcg  500 mcg Oral BID Fenton, Clint R, PA      . dorzolamide-timolol (COSOPT) 22.3-6.8 MG/ML ophthalmic solution 1 drop  1 drop Left Eye BID Fenton, Clint R, PA      . irbesartan (AVAPRO) tablet 300 mg  300 mg Oral QPC supper Fenton, Clint R, PA      . latanoprost (XALATAN) 0.005 % ophthalmic solution 1 drop  1 drop Both Eyes QHS Fenton, Clint R, PA      . magnesium sulfate IVPB 2 g 50 mL  2 g Intravenous Once ,  Hassell Done, MD      . metFORMIN (GLUCOPHAGE) tablet 500 mg  500 mg Oral BID WC Fenton, Clint R, PA      . metoprolol succinate (TOPROL-XL) 24 hr tablet 100 mg  100 mg Oral Q supper Fenton, Clint R, PA      . [START ON 01/07/2020]  potassium chloride (KLOR-CON) CR tablet 10 mEq  10 mEq Oral Daily Fenton, Clint R, PA      . rivaroxaban (XARELTO) tablet 20 mg  20 mg Oral Q supper Fenton, Clint R, PA      . sodium chloride flush (NS) 0.9 % injection 3 mL  3 mL Intravenous Q12H Fenton, Clint R, PA      . sodium chloride flush (NS) 0.9 % injection 3 mL  3 mL Intravenous PRN Fenton, Clint R, PA        No Known Allergies  Social History   Socioeconomic History  . Marital status: Married    Spouse name: Not on file  . Number of children: Not on file  . Years of education: Not on file  . Highest education level: Not on  file  Occupational History  . Not on file  Tobacco Use  . Smoking status: Former Smoker    Types: Cigarettes    Quit date: 1978    Years since quitting: 43.5  . Smokeless tobacco: Former Systems developer    Quit date: 1985  Vaping Use  . Vaping Use: Never used  Substance and Sexual Activity  . Alcohol use: Yes    Comment: social  . Drug use: Never  . Sexual activity: Not on file  Other Topics Concern  . 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     Social Determinants of Health   Financial Resource Strain:   . Difficulty of Paying Living Expenses:   Food Insecurity:   . Worried About Charity fundraiser in the Last Year:   . Arboriculturist in the Last Year:   Transportation Needs:   . Film/video editor (Medical):   Marland Kitchen Lack of Transportation (Non-Medical):   Physical Activity:   . Days of Exercise per Week:   . Minutes of Exercise per Session:   Stress:   . Feeling of Stress :   Social Connections:   . Frequency of Communication with Friends and Family:   . Frequency of Social Gatherings with Friends and Family:   . Attends Religious Services:   . Active Member of Clubs or Organizations:   . Attends Archivist Meetings:   Marland Kitchen Marital Status:   Intimate Partner Violence:   . Fear of Current or Ex-Partner:   . Emotionally Abused:   Marland Kitchen Physically Abused:   . Sexually Abused:      ROS- All systems are reviewed and negative except as per the HPI above.  Physical Exam: Vitals:   01/06/20 1323 01/06/20 1327  BP:  130/80  Resp:  18  Temp: 97.8 F (36.6 C)   TempSrc: Oral   SpO2:  99%    GEN- The patient is well appearing obese male, alert and oriented x 3 today.   HEENT-head normocephalic, atraumatic, sclera clear, conjunctiva pink, hearing intact, trachea midline. Lungs- Clear to ausculation bilaterally, normal work of breathing Heart- irregular rate and rhythm, no murmurs, rubs or gallops  GI- soft,  NT, ND, + BS Extremities- no clubbing, cyanosis, or edema MS- no significant deformity or atrophy Skin- no rash or lesion Psych- euthymic mood, full affect Neuro- strength and sensation are intact   Wt Readings from Last 3 Encounters:  01/06/20 106.6 kg  01/02/20 106.5 kg  12/26/19 107.5 kg    EKG today demonstrates afib HR 66, QRS 94, QTc 400  Echo 04/17/19 demonstrated  Severely dilated LV, EF 55-60%, normal LA size  Epic records are reviewed at length today  CHA2DS2-VASc Score = 3  The patient's score is based upon: CHF History: 0 HTN History: 1 Age : 1 Diabetes History: 1 Stroke History: 0 Vascular Disease History: 0 Gender: 0      ASSESSMENT AND PLAN: 1. Persistent Atrial Fibrillation (ICD10:  I48.19) The patient's CHA2DS2-VASc score is 3, indicating a 3.2% annual risk of stroke.   Patient wants to pursue dofetilide, aware of risk vs benefit Aware of price of dofetilide No benadryl use Labs today show creatinine at 1.12, K+ 4.2 and mag 2.0, CrCl calculated at 93 mL/min PharmD has screened drugs and HCTZ stopped 7/16. QTc in SR 385 ms Continue Xarelto 20 mg daily  2. Secondary Hypercoagulable State (ICD10:  D68.69) The patient is at significant risk for stroke/thromboembolism based upon his CHA2DS2-VASc Score of 3.  Continue Rivaroxaban (Xarelto).   3. Obesity There is no height or weight on file to calculate BMI. Lifestyle modification was discussed and encouraged including regular physical activity and weight reduction.  4. HTN Stable, no changes today.   To be admitted later today once a bed becomes available.    [facility] [address] [place], [place] [address] [phone] 01/06/2020 4:21 PM   I have seen and examined this patient with Adline Peals.  Agree with above, note added to reflect my findings.  On exam, RRR, no murmurs, lungs clear.  Patient admitted to the hospital for dofetilide load.   Dofetilide to start tonight.  ECG after each dose.   monitor on telemetry.   M.  MD 01/06/2020 4:21 PM

## 2020-01-06 NOTE — Progress Notes (Signed)
Primary Care Physician: Ocie Doyne., MD (Inactive) Primary Cardiologist: Dr Percival Spanish  Primary Electrophysiologist: none Referring Physician: Dr Olga Millers Frank Chase is a 69 y.o. male with a history of HTN, DM and persistent atrial fibrillation who presents for follow up in the [facility]. The patient was initially diagnosed with atrial fibrillation 04/2019 incidentally by the patient's wife who noted an irregular heart beat. He presented to his PCP who confirmed afib on ECG. He was started on Xarelto and underwent DCCV on 05/12/19. Patient did well until follow up 11/2019 when he noted weakness and SOB with exertion and ECG showed he was back in afib. Patient is on Xarelto for a CHADS2VASC score of 3. Dr Percival Spanish recommended dofetilide at that time. He denies any missed doses of anticoagulation in the last 3 weeks.   On follow up today, patient presents for dofetilide admission. He stopped HCTZ 01/02/20. He is in rate controlled afib today with symptoms of weakness and SOB. He denies any missed doses of anticoagulation in the last 3 weeks.  Today, he denies symptoms of palpitations, chest pain, orthopnea, PND, lower extremity edema, presyncope, syncope, snoring, daytime somnolence, bleeding, or neurologic sequela. The patient is tolerating medications without difficulties and is otherwise without complaint today.    Atrial Fibrillation Risk Factors:  he does not have symptoms or diagnosis of sleep apnea. he does not have a history of rheumatic fever.   he has a BMI of Body mass index is 33.72 kg/m.Marland Kitchen Filed Weights   01/06/20 0943  Weight: 106.6 kg    Family History  Problem Relation Age of Onset  . Alzheimer's disease Mother      Atrial Fibrillation Management history:  Previous antiarrhythmic drugs: none Previous cardioversions: 05/12/19 Previous ablations: none CHADS2VASC score: 3 Anticoagulation history: Xarelto   Past Medical  History:  Diagnosis Date  . Hypertension    Past Surgical History:  Procedure Laterality Date  . CARDIOVERSION N/A 05/12/2019   Procedure: CARDIOVERSION;  Surgeon: Acie Fredrickson Wonda Cheng, MD;  Location: [facility] ENDOSCOPY;  Service: Cardiovascular;  Laterality: N/A;  . WRIST SURGERY      Current Outpatient Medications  Medication Sig Dispense Refill  . amLODipine (NORVASC) 10 MG tablet Take 1 tablet (10 mg total) by mouth at bedtime. 90 tablet 3  . COSOPT 22.3-6.8 MG/ML ophthalmic solution Place 1 drop into the left eye 2 (two) times daily.    . Glucosamine-Chondroitin (COSAMIN DS PO) Take 2 tablets by mouth at bedtime.    . metFORMIN (GLUCOPHAGE) 500 MG tablet Take by mouth 2 (two) times daily with a meal.    . metoprolol succinate (TOPROL-XL) 100 MG 24 hr tablet Take 100 mg by mouth daily with supper. Take with or immediately following a meal.    . olmesartan (BENICAR) 40 MG tablet Take 1 tablet (40 mg total) by mouth daily. 30 tablet 6  . potassium chloride (KLOR-CON) 10 MEQ tablet Take 1 tablet (10 mEq total) by mouth daily. 30 tablet 3  . Travoprost, BAK Free, (TRAVATAN) 0.004 % SOLN ophthalmic solution Place 1 drop into both eyes at bedtime.     . TRUE METRIX BLOOD GLUCOSE TEST test strip     . XARELTO 20 MG TABS tablet Take 20 mg by mouth at bedtime.      No current facility-administered medications for this encounter.    No Known Allergies  Social History   Socioeconomic History  . Marital status: Married    Spouse  name: Not on file  . Number of children: Not on file  . Years of education: Not on file  . Highest education level: Not on file  Occupational History  . Not on file  Tobacco Use  . Smoking status: Former Smoker    Types: Cigarettes    Quit date: 1978    Years since quitting: 43.5  . Smokeless tobacco: Former Systems developer    Quit date: 1985  Substance and Sexual Activity  . Alcohol use: Yes    Comment: social  . Drug use: Never  . Sexual activity: Not on file  Other  Topics Concern  . 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     Social Determinants of Health   Financial Resource Strain:   . Difficulty of Paying Living Expenses:   Food Insecurity:   . Worried About Charity fundraiser in the Last Year:   . Arboriculturist in the Last Year:   Transportation Needs:   . Film/video editor (Medical):   Marland Kitchen Lack of Transportation (Non-Medical):   Physical Activity:   . Days of Exercise per Week:   . Minutes of Exercise per Session:   Stress:   . Feeling of Stress :   Social Connections:   . Frequency of Communication with Friends and Family:   . Frequency of Social Gatherings with Friends and Family:   . Attends Religious Services:   . Active Member of Clubs or Organizations:   . Attends Archivist Meetings:   Marland Kitchen Marital Status:   Intimate Partner Violence:   . Fear of Current or Ex-Partner:   . Emotionally Abused:   Marland Kitchen Physically Abused:   . Sexually Abused:      ROS- All systems are reviewed and negative except as per the HPI above.  Physical Exam: Vitals:   01/06/20 0943  BP: 118/70  Pulse: 66  Weight: 106.6 kg  Height: 5\' 10"  (1.778 m)    GEN- The patient is well appearing obese male, alert and oriented x 3 today.   HEENT-head normocephalic, atraumatic, sclera clear, conjunctiva pink, hearing intact, trachea midline. Lungs- Clear to ausculation bilaterally, normal work of breathing Heart- irregular rate and rhythm, no murmurs, rubs or gallops  GI- soft, NT, ND, + BS Extremities- no clubbing, cyanosis, or edema MS- no significant deformity or atrophy Skin- no rash or lesion Psych- euthymic mood, full affect Neuro- strength and sensation are intact   Wt Readings from Last 3 Encounters:  01/06/20 106.6 kg  01/02/20 106.5 kg  12/26/19 107.5 kg    EKG today demonstrates afib HR 66, QRS 94, QTc 400  Echo 04/17/19 demonstrated  Severely dilated LV, EF  55-60%, normal LA size  Epic records are reviewed at length today  CHA2DS2-VASc Score = 3  The patient's score is based upon: CHF History: 0 HTN History: 1 Age : 1 Diabetes History: 1 Stroke History: 0 Vascular Disease History: 0 Gender: 0      ASSESSMENT AND PLAN: 1. Persistent Atrial Fibrillation (ICD10:  I48.19) The patient's CHA2DS2-VASc score is 3, indicating a 3.2% annual risk of stroke.   Patient wants to pursue dofetilide, aware of risk vs benefit Aware of price of dofetilide No benadryl use Labs today show creatinine at 1.12, K+ 4.2 and mag 2.0, CrCl calculated at 93 mL/min PharmD has screened drugs and HCTZ stopped 7/16. QTc in SR 385 ms Continue Xarelto 20  mg daily  2. Secondary Hypercoagulable State (ICD10:  D68.69) The patient is at significant risk for stroke/thromboembolism based upon his CHA2DS2-VASc Score of 3.  Continue Rivaroxaban (Xarelto).   3. Obesity Body mass index is 33.72 kg/m. Lifestyle modification was discussed and encouraged including regular physical activity and weight reduction.  4. HTN Stable, no changes today.   To be admitted later today once a bed becomes available.    [facility] [address] [place], [place] [address] [phone] 01/06/2020 11:42 AM

## 2020-01-06 NOTE — Progress Notes (Signed)
Pharmacy: Dofetilide (Tikosyn) - Initial Consult Assessment and Electrolyte Replacement  Pharmacy consulted to assist in monitoring and replacing electrolytes in this 69 y.o. male admitted on 01/06/2020 undergoing dofetilide initiation. First dofetilide dose: 7/20@2000   Assessment:  Patient Exclusion Criteria: If any screening criteria checked as "Yes", then  patient  should NOT receive dofetilide until criteria item is corrected.  If Yes please indicate correction plan.  YES  NO Patient  Exclusion Criteria Correction Plan   []   [x]   Baseline QTc interval is greater than or equal to 440 msec. IF above YES box checked dofetilide contraindicated unless patient has ICD; then may proceed if QTc 500-550 msec or with known ventricular conduction abnormalities may proceed with QTc 550-600 msec. QTc = 0.43 QTc 400 in Afib from EKG this morning   []   [x]   Patient is known or suspected to have a digoxin level greater than 2 ng/ml: No results found for: DIGOXIN     []   [x]   Creatinine clearance less than 20 ml/min (calculated using Cockcroft-Gault, actual body weight and serum creatinine): Estimated Creatinine Clearance: 75 mL/min (by C-G formula based on SCr of 1.12 mg/dL).     []   [x]  Patient has received drugs known to prolong the QT intervals within the last 48 hours (phenothiazines, tricyclics or tetracyclic antidepressants, erythromycin, H-1 antihistamines, cisapride, fluoroquinolones, azithromycin). Updated information on QT prolonging agents is available to be searched on the following database:QT prolonging agents     []   [x]   Patient received a dose of hydrochlorothiazide (Oretic) alone or in any combination including triamterene (Dyazide, Maxzide) in the last 48 hours.    []   [x]  Patient received a medication known to increase dofetilide plasma concentrations prior to initial dofetilide dose:   Trimethoprim (Primsol, Proloprim) in the last 36 hours  Verapamil (Calan,  Verelan) in the last 36 hours or a sustained release dose in the last 72 hours  Megestrol (Megace) in the last 5 days   Cimetidine (Tagamet) in the last 6 hours  Ketoconazole (Nizoral) in the last 24 hours  Itraconazole (Sporanox) in the last 48 hours   Prochlorperazine (Compazine) in the last 36 hours     []   [x]   Patient is known to have a history of torsades de pointes; congenital or acquired long QT syndromes.    []   [x]   Patient has received a Class 1 antiarrhythmic with less than 2 half-lives since last dose. (Disopyramide, Quinidine, Procainamide, Lidocaine, Mexiletine, Flecainide, Propafenone)    []   [x]   Patient has received amiodarone therapy in the past 3 months or amiodarone level is greater than 0.3 ng/ml.    Patient has been appropriately anticoagulated with Xarelto.  Labs:    Component Value Date/Time   K 4.2 01/06/2020 0939   MG 2.0 01/06/2020 6222     Plan: Potassium: K >/= 4: Appropriate to initiate Tikosyn, no replacement needed    Magnesium: Mg 1.8-2: Give Mg 2 gm IV x1 to prevent Mg from dropping below 1.8 - do not need to recheck Mg. Appropriate to initiate Tikosyn   Thank you for allowing pharmacy to participate in this patient's care   Antonietta Jewel, PharmD, [place] Pharmacist  Phone: [phone] 01/06/2020 1:37 PM  Please check AMION for all [place] phone numbers After 10:00 PM, call [place] [phone]

## 2020-01-07 ENCOUNTER — Other Ambulatory Visit: Payer: Self-pay

## 2020-01-07 LAB — GLUCOSE, CAPILLARY
Glucose-Capillary: 110 mg/dL — ABNORMAL HIGH (ref 70–99)
Glucose-Capillary: 93 mg/dL (ref 70–99)
Glucose-Capillary: 110 mg/dL — ABNORMAL HIGH (ref 70–99)
Glucose-Capillary: 117 mg/dL — ABNORMAL HIGH (ref 70–99)

## 2020-01-07 LAB — HIV ANTIBODY (ROUTINE TESTING W REFLEX): HIV Screen 4th Generation wRfx: NONREACTIVE

## 2020-01-07 LAB — BASIC METABOLIC PANEL
Anion gap: 9 (ref 5–15)
BUN: 13 mg/dL (ref 8–23)
CO2: 23 mmol/L (ref 22–32)
Calcium: 8.7 mg/dL — ABNORMAL LOW (ref 8.9–10.3)
Chloride: 107 mmol/L (ref 98–111)
Creatinine, Ser: 0.98 mg/dL (ref 0.61–1.24)
GFR calc Af Amer: >60 mL/min (ref >60)
GFR calc non Af Amer: >60 mL/min (ref >60)
Glucose, Bld: 102 mg/dL — ABNORMAL HIGH (ref 70–99)
Potassium: 3.7 mmol/L (ref 3.5–5.1)
Sodium: 139 mmol/L (ref 135–145)

## 2020-01-07 LAB — MAGNESIUM: Magnesium: 2.2 mg/dL (ref 1.7–2.4)

## 2020-01-07 MED ORDER — POTASSIUM CHLORIDE CRYS ER 20 MEQ PO TBCR
40.0000 meq | EXTENDED_RELEASE_TABLET | Freq: Once | ORAL | Status: AC
  Administered 2020-01-07: 40 meq via ORAL
  Filled 2020-01-07: qty 2

## 2020-01-07 NOTE — Progress Notes (Addendum)
Electrophysiology Rounding Note  Patient Name: Frank Chase Date of Encounter: 01/07/2020  Primary Cardiologist: Minus Breeding, MD  Electrophysiologist: None    Subjective   Pt remains in afib on Tikosyn 500 mcg BID   QTc from EKG last pm shows stable QTc at 460 ms  The patient is doing well today.  At this time, the patient denies chest pain, shortness of breath, or any new concerns.  Inpatient Medications    Scheduled Meds:  amLODipine  10 mg Oral QHS   dofetilide  500 mcg Oral BID   dorzolamide-timolol  1 drop Left Eye BID   irbesartan  300 mg Oral QPC supper   latanoprost  1 drop Both Eyes QHS   metFORMIN  500 mg Oral BID WC   metoprolol succinate  100 mg Oral Q supper   potassium chloride  10 mEq Oral Daily   rivaroxaban  20 mg Oral Q supper   sodium chloride flush  3 mL Intravenous Q12H   Continuous Infusions:  sodium chloride     PRN Meds: sodium chloride, sodium chloride flush   Vital Signs    Vitals:   01/06/20 1828 01/06/20 2006 01/07/20 0038 01/07/20 0608  BP: 126/75 132/87 103/77 128/77  Pulse: 68 84 73 77  Resp: 20 20 20 16   Temp: 97.8 F (36.6 C) 98.3 F (36.8 C) (!) 97.5 F (36.4 C) (!) 97.5 F (36.4 C)  TempSrc: Oral Oral Oral Oral  SpO2: 99% 99% 99% 99%  Weight:    105.6 kg    Intake/Output Summary (Last 24 hours) at 01/07/2020 0748 Last data filed at 01/06/2020 2021 Gross per 24 hour  Intake 243 ml  Output --  Net 243 ml   Filed Weights   01/07/20 0608  Weight: 105.6 kg    Physical Exam    GEN- The patient is well appearing, alert and oriented x 3 today.   Head- normocephalic, atraumatic Eyes-  Sclera clear, conjunctiva pink Ears- hearing intact Oropharynx- clear Neck- supple Lungs- Clear to ausculation bilaterally, normal work of breathing Heart- Regular rate and rhythm, no murmurs, rubs or gallops GI- soft, NT, ND, + BS Extremities- no clubbing, cyanosis, or edema Skin- no rash or lesion Psych- euthymic mood,  full affect Neuro- strength and sensation are intact  Labs    CBC No results for input(s): WBC, NEUTROABS, HGB, HCT, MCV, PLT in the last 72 hours. Basic Metabolic Panel Recent Labs    01/06/20 0939 01/07/20 0600  NA 136 139  K 4.2 3.7  CL 103 107  CO2 23 23  GLUCOSE 100* 102*  BUN 14 13  CREATININE 1.12 0.98  CALCIUM 9.1 8.7*  MG 2.0 2.2    Potassium  Date/Time Value Ref Range Status  01/07/2020 06:00 AM 3.7 3.5 - 5.1 mmol/L Final   Magnesium  Date/Time Value Ref Range Status  01/07/2020 06:00 AM 2.2 1.7 - 2.4 mg/dL Final    Comment:    Performed at [facility], [place] [address] [place], [place] [address]    Telemetry    AF 70-80s (personally reviewed)  Radiology    No results found.   Patient Profile     Frank Chase is a 69 y.o. male with a past medical history significant for persistent atrial fibrillation.  They were admitted for tikosyn load.   Assessment & Plan    Persistent atrial fibrillation Pt remains in afib on Tikosyn 500 mcg BID  Continue Xarelto K 3.7. Mg  2.2.  supp K.  CHA2DS2VASC is at least 2   If pt does not convert chemically, plan on DCCV tomorrow    For questions or updates, please contact [place] Please consult [email or web address] for contact info under Cardiology/STEMI.  Signed, Shirley Friar, PA-C  01/07/2020, 7:48 AM   I have seen and examined this patient with Oda Kilts.  Agree with above, note added to reflect my findings.  On exam, irregular, no murmurs, lungs clear. Tolerating tikosyn without issue.  continue with current dose.     M.  MD 01/07/2020 8:33 AM

## 2020-01-07 NOTE — H&P (View-Only) (Signed)
Morning EKG reviewed  Shows pt remains in afib at 87 bpm with stable QTc at ~480 ms.  Continue Tikosyn 500 mcg BID.   Pt will be NPO after midnight for DCCV if remains in [place], [place]  Pager: [phone]  01/07/2020 12:23 PM

## 2020-01-07 NOTE — Progress Notes (Signed)
Morning EKG reviewed  Shows pt remains in afib at 87 bpm with stable QTc at ~480 ms.  Continue Tikosyn 500 mcg BID.   Pt will be NPO after midnight for DCCV if remains in [place], [place]  Pager: [phone]  01/07/2020 12:23 PM

## 2020-01-07 NOTE — Care Management (Signed)
1609 01-07-20 Patient presented for Tikosyn Load-patient is aware of cost and he wants a prescription for 30 day without refills sent to [facility] in [place]. Medication is in stock at [facility]. Patient then wants 90 day supply refills sent to [facility]. No further needs from Case Manager at this time. Bethena Roys, RN,BSN Case Manager

## 2020-01-07 NOTE — TOC Benefit Eligibility Note (Signed)
Transition of Care [facility]) Benefit Eligibility Note    Patient Details  Name: Frank Chase MRN: [id number] Date of Birth: [birth year]   Medication/Dose: dofetilide bid for 30 day supply 560mcg,250mcg and or 161mcg  Covered?: Yes  Tier:  (tier 4)  Prescription Coverage Preferred Pharmacy: [facility] with Person/Company/Phone Number:: [facility] [place] [facility] [employer] [phone]  Co-Pay: 557mcg $63.58, 240mcg and or 182mcg $21.73 BID  Prior Approval: No  Deductible:  (No Deductible)       Shelda Altes Phone Number: 01/07/2020, 5:09 PM

## 2020-01-07 NOTE — Progress Notes (Signed)
Pharmacy: Dofetilide (Tikosyn) - Follow Up Assessment and Electrolyte Replacement  Pharmacy consulted to assist in monitoring and replacing electrolytes in this 69 y.o. male admitted on 01/06/2020 undergoing dofetilide initiation. First dofetilide dose: 01/07/19 @ 2017  Labs:    Component Value Date/Time   K 3.7 01/07/2020 0600   MG 2.2 01/07/2020 0600     Plan: Potassium: K 3.5-3.7:  Give KCl 60 mEq po x1  - 10 mEq daily + 40 mEq x1 ordered by EP PA, f/u in AM  Magnesium: Mg > 2: No additional supplementation needed  Thank you for allowing pharmacy to participate in this patient's care   Fara Olden, PharmD PGY-1 Pharmacy Resident 01/07/2020 10:04 AM  Please check [email or web address] for unit-specific pharmacy phone numbers.

## 2020-01-08 ENCOUNTER — Encounter (HOSPITAL_COMMUNITY): Payer: Self-pay | Admitting: Cardiology

## 2020-01-08 ENCOUNTER — Inpatient Hospital Stay (HOSPITAL_COMMUNITY): Payer: Medicare HMO | Admitting: Certified Registered"

## 2020-01-08 ENCOUNTER — Other Ambulatory Visit: Payer: Self-pay

## 2020-01-08 ENCOUNTER — Encounter (HOSPITAL_COMMUNITY)
Admission: RE | Disposition: A | Discharge status: Home / Self Care | Payer: Self-pay | Source: Ambulatory Visit | Attending: Cardiology

## 2020-01-08 DIAGNOSIS — I4819 Other persistent atrial fibrillation: Principal | ICD-10-CM

## 2020-01-08 HISTORY — PX: CARDIOVERSION: SHX1299

## 2020-01-08 LAB — BASIC METABOLIC PANEL
Anion gap: 7 (ref 5–15)
BUN: 13 mg/dL (ref 8–23)
CO2: 22 mmol/L (ref 22–32)
Calcium: 8.7 mg/dL — ABNORMAL LOW (ref 8.9–10.3)
Chloride: 108 mmol/L (ref 98–111)
Creatinine, Ser: 1.03 mg/dL (ref 0.61–1.24)
GFR calc Af Amer: >60 mL/min (ref >60)
GFR calc non Af Amer: >60 mL/min (ref >60)
Glucose, Bld: 105 mg/dL — ABNORMAL HIGH (ref 70–99)
Potassium: 3.8 mmol/L (ref 3.5–5.1)
Sodium: 137 mmol/L (ref 135–145)

## 2020-01-08 LAB — PROTIME-INR
INR: 1.5 — ABNORMAL HIGH (ref 0.8–1.2)
Prothrombin Time: 17.3 seconds — ABNORMAL HIGH (ref 11.4–15.2)

## 2020-01-08 LAB — MAGNESIUM: Magnesium: 1.9 mg/dL (ref 1.7–2.4)

## 2020-01-08 LAB — GLUCOSE, CAPILLARY: Glucose-Capillary: 114 mg/dL — ABNORMAL HIGH (ref 70–99)

## 2020-01-08 SURGERY — CARDIOVERSION
Anesthesia: General

## 2020-01-08 MED ORDER — PROPOFOL 10 MG/ML IV BOLUS
INTRAVENOUS | Status: DC | PRN
  Administered 2020-01-08: 80 mg via INTRAVENOUS

## 2020-01-08 MED ORDER — POTASSIUM CHLORIDE CRYS ER 20 MEQ PO TBCR
40.0000 meq | EXTENDED_RELEASE_TABLET | Freq: Once | ORAL | Status: AC
  Administered 2020-01-08: 40 meq via ORAL
  Filled 2020-01-08: qty 2

## 2020-01-08 MED ORDER — LIDOCAINE 2% (20 MG/ML) 5 ML SYRINGE
INTRAMUSCULAR | Status: DC | PRN
  Administered 2020-01-08: 100 mg via INTRAVENOUS

## 2020-01-08 MED ORDER — MAGNESIUM SULFATE 2 GM/50ML IV SOLN
2.0000 g | Freq: Once | INTRAVENOUS | Status: AC
  Administered 2020-01-08: 2 g via INTRAVENOUS
  Filled 2020-01-08: qty 50

## 2020-01-08 MED ORDER — SODIUM CHLORIDE 0.9 % IV SOLN
INTRAVENOUS | Status: DC | PRN
Start: 2020-01-08 — End: 2020-01-08

## 2020-01-08 MED ORDER — METOPROLOL SUCCINATE ER 50 MG PO TB24: 50.0000 mg | ORAL_TABLET | Freq: Every day | ORAL | Status: DC

## 2020-01-08 NOTE — CV Procedure (Signed)
   DIRECT CURRENT CARDIOVERSION  NAME:  Frank Chase    MRN: [id number] DOB:  [birth year]    ADMIT DATE: 01/06/2020  Indication:  Symptomatic atrial fibrillation  Procedure Note:  The patient signed informed consent.  They have had had therapeutic anticoagulation with xarelto greater than 3 weeks.  Anesthesia was administered by Dr. Christella Hartigan.  Adequate airway was maintained throughout and vital followed per protocol.  They were cardioverted x 1 with 200J of biphasic synchronized energy.  They converted to NSR.  There were no apparent complications.  The patient had normal neuro status and respiratory status post procedure with vitals stable as recorded elsewhere.    Follow up: They will continue on current medical therapy and follow up with cardiology as scheduled.  [place] T. Audie Box, [place]  [address], [place], [place] [address] [phone]  11:04 AM

## 2020-01-08 NOTE — Interval H&P Note (Signed)
History and Physical Interval Note:  01/08/2020 10:39 AM  Frank Chase  has presented today for surgery, with the diagnosis of afib.  The various methods of treatment have been discussed with the patient and family. After consideration of risks, benefits and other options for treatment, the patient has consented to  Procedure(s): CARDIOVERSION (N/A) as a surgical intervention.  The patient's history has been reviewed, patient examined, no change in status, stable for surgery.  I have reviewed the patient's chart and labs.  Questions were answered to the patient's satisfaction.    DCCV for Afib. On xarelto. NPO. On tikosyn.   [place] T. Audie Box, [place]  [address] [place], [place] [address] [phone]  10:39 AM

## 2020-01-08 NOTE — Progress Notes (Signed)
Pharmacy: Dofetilide (Tikosyn) - Follow Up Assessment and Electrolyte Replacement  Pharmacy consulted to assist in monitoring and replacing electrolytes in this 69 y.o. male admitted on 01/06/2020 undergoing dofetilide initiation. First dofetilide dose: 01/07/19 @ 2017  Labs:    Component Value Date/Time   K 3.8 01/08/2020 0415   MG 1.9 01/08/2020 0415     Plan: Potassium: K 3.8-3.9:  Give KCl 40 mEq po x1  - 10 mEq daily + 40 mEq x1 ordered by EP PA, f/u in AM  Magnesium: Mg 1.8-2: Give Mg 2 gm IV x1  - 2g IV ordered by EP PA, f/u in AM  Thank you for allowing pharmacy to participate in this patient's care   Rebbeca Paul, PharmD PGY1 Pharmacy Resident 01/08/2020 9:14 AM  Please check [email or web address] for unit-specific pharmacy phone numbers.

## 2020-01-08 NOTE — Discharge Summary (Addendum)
ELECTROPHYSIOLOGY PROCEDURE DISCHARGE SUMMARY    Patient ID: Frank Chase,  MRN: [id number], DOB/AGE: May 09, 1951 69 y.o.  Admit date: 01/06/2020 Discharge date: 01/09/2020  Primary Care Physician: Ocie Doyne., MD (Inactive)  Primary Cardiologist: Minus Breeding, MD  Electrophysiologist: None   Primary Discharge Diagnosis:  1.  Persistent atrial fibrillation status post Tikosyn loading this admission  Secondary Discharge Diagnosis:  HTN  No Known Allergies   Procedures This Admission:  1.  Tikosyn loading 2.  Direct current cardioversion on Thursday January 08, 2020  by Dr Audie Box which successfully restored SR.  There were no early apparent complications.   Brief HPI: FREDIE Chase is a 69 y.o. male with a past medical history as noted above.  They were referred to EP in the outpatient setting for treatment options of atrial fibrillation.  Risks, benefits, and alternatives to Tikosyn were reviewed with the patient who wished to proceed.    Hospital Course:  The patient was admitted and Tikosyn was initiated.  Renal function and electrolytes were followed during the hospitalization.  Their QTc remained stable.  On 01/08/2020 they underwent direct current cardioversion which restored sinus rhythm.  They were monitored until discharge on telemetry which demonstrated NSR.  On the day of discharge, they were examined by Dr. Curt Bears  who considered them stable for discharge to home.  QTc remains stable with final dose. Follow-up has been arranged with the Atrial Fibrillation clinic in approximately 1 week and with Dr. Curt Bears or an EP APP in 4 weeks.   [place] called personally to assure tikosyn in stock for pick up. They pre-ordered 1 bottle for him. He  get refills through [employer].   Physical Exam: Vitals:   01/08/20 1558 01/08/20 1925 01/09/20 0421 01/09/20 0814  BP: (!) 131/75 (!) 139/78 125/81 118/65  Pulse: 65 74 68 58  Resp: 18 18 18 18   Temp:  97.6 F (36.4 C) 98.1 F (36.7 C) 98.5 F (36.9 C) 98.4 F (36.9 C)  TempSrc: Oral Oral Oral Oral  SpO2: 98% 98% 98% 99%  Weight:   (!) 105.2 kg   Height:        GEN- The patient is well appearing, alert and oriented x 3 today.   HEENT: normocephalic, atraumatic; sclera clear, conjunctiva pink; hearing intact; oropharynx clear; neck supple, no JVP Lymph- no cervical lymphadenopathy Lungs- Clear to ausculation bilaterally, normal work of breathing.  No wheezes, rales, rhonchi Heart- Regular rate and rhythm, no murmurs, rubs or gallops, PMI not laterally displaced GI- soft, non-tender, non-distended, bowel sounds present, no hepatosplenomegaly Extremities- no clubbing, cyanosis, or edema; DP/PT/radial pulses 2+ bilaterally MS- no significant deformity or atrophy Skin- warm and dry, no rash or lesion Psych- euthymic mood, full affect Neuro- strength and sensation are intact   Labs:   Lab Results  Component Value Date   WBC 7.2 05/07/2019   HGB 16.6 05/07/2019   HCT 46.8 05/07/2019   MCV 86 05/07/2019   PLT 218 05/07/2019    Recent Labs  Lab 01/09/20 0510  NA 138  K 3.9  CL 107  CO2 22  BUN 11  CREATININE 0.91  CALCIUM 8.5*  GLUCOSE 108*     Discharge Medications:  Allergies as of 01/09/2020   No Known Allergies     Medication List    STOP taking these medications   metoprolol succinate 100 MG 24 hr tablet Commonly known as: TOPROL-XL     TAKE these medications  amLODipine 10 MG tablet Commonly known as: NORVASC Take 1 tablet (10 mg total) by mouth at bedtime.   COSAMIN DS PO Take 2 tablets by mouth at bedtime.   Cosopt 22.3-6.8 MG/ML ophthalmic solution Generic drug: dorzolamide-timolol Place 1 drop into the left eye 2 (two) times daily.   dofetilide 500 MCG capsule Commonly known as: TIKOSYN Take 1 capsule (500 mcg total) by mouth 2 (two) times daily.   metFORMIN 500 MG tablet Commonly known as: GLUCOPHAGE Take by mouth 2 (two) times daily  with a meal.   olmesartan 40 MG tablet Commonly known as: BENICAR Take 1 tablet (40 mg total) by mouth daily.   potassium chloride 10 MEQ tablet Commonly known as: KLOR-CON Take 1 tablet (10 mEq total) by mouth daily.   Travoprost (BAK Free) 0.004 % Soln ophthalmic solution Commonly known as: TRAVATAN Place 1 drop into both eyes at bedtime.   True Metrix Blood Glucose Test test strip Generic drug: glucose blood   Xarelto 20 MG Tabs tablet Generic drug: rivaroxaban Take 20 mg by mouth every evening.       Disposition:    Follow-up Information    Constance Haw, MD Follow up on 02/24/2020.   Specialty: Cardiology Why: at 230 for 4-6 week post tikosyn follow up Contact information: [address] [place] [address] [place] Follow up on 01/16/2020.   Specialty: Cardiology Why: at 1100 am for post hospital follow up Contact information: [address] [id number] [place] [phone]              Duration of Discharge Encounter: Greater than 30 minutes including physician time.  Signed, Shirley Friar, PA-C  01/09/2020 11:15 AM    I have seen and examined this patient with Oda Kilts.  Agree with above, note added to reflect my findings.  On exam, RRR, no murmurs, lungs clear. Post Tikosyn load. Required DCCV post 4th dose. Plan for discharge today with follow up in clinic.     M.  MD 01/09/2020 11:17 AM

## 2020-01-08 NOTE — Anesthesia Preprocedure Evaluation (Signed)
Anesthesia Evaluation  Patient identified by MRN, date of birth, ID band Patient awake    Reviewed: Allergy & Precautions, NPO status , Patient's Chart, lab work & pertinent test results, reviewed documented beta blocker date and time   History of Anesthesia Complications Negative for: history of anesthetic complications  Airway Mallampati: II  TM Distance: >3 FB Neck ROM: Full    Dental   Pulmonary neg pulmonary ROS, former smoker,    Pulmonary exam normal        Cardiovascular hypertension, + dysrhythmias Atrial Fibrillation  Rhythm:Irregular Rate:Normal     Neuro/Psych negative neurological ROS  negative psych ROS   GI/Hepatic negative GI ROS, Neg liver ROS,   Endo/Other  diabetes  Renal/GU negative Renal ROS  negative genitourinary   Musculoskeletal negative musculoskeletal ROS (+)   Abdominal   Peds  Hematology negative hematology ROS (+)   Anesthesia Other Findings   Reproductive/Obstetrics                             Anesthesia Physical Anesthesia Plan  ASA: III  Anesthesia Plan: General   Post-op Pain Management:    Induction: Intravenous  PONV Risk Score and Plan: 2 and TIVA and Treatment may vary due to age or medical condition  Airway Management Planned: Mask  Additional Equipment: None  Intra-op Plan:   Post-operative Plan:   Informed Consent: I have reviewed the patients History and Physical, chart, labs and discussed the procedure including the risks, benefits and alternatives for the proposed anesthesia with the patient or authorized representative who has indicated his/her understanding and acceptance.       Plan Discussed with:   Anesthesia Plan Comments:         Anesthesia Quick Evaluation

## 2020-01-08 NOTE — Anesthesia Postprocedure Evaluation (Signed)
Anesthesia Post Note  Patient: Frank Chase  Procedure(s) Performed: CARDIOVERSION (N/A )     Patient location during evaluation: Endoscopy Anesthesia Type: General Level of consciousness: awake and alert Pain management: pain level controlled Vital Signs Assessment: post-procedure vital signs reviewed and stable Respiratory status: spontaneous breathing, nonlabored ventilation and respiratory function stable Cardiovascular status: blood pressure returned to baseline and stable Postop Assessment: no apparent nausea or vomiting Anesthetic complications: no   No complications documented.  Last Vitals:  Vitals:   01/08/20 1130 01/08/20 1140  BP: (!) 101/57 (!) 106/59  Pulse: (!) 44 47  Resp: 14 15  Temp:    SpO2: 97% 93%    Last Pain:  Vitals:   01/08/20 1140  TempSrc:   PainSc: 0-No pain                 [name]

## 2020-01-08 NOTE — Anesthesia Procedure Notes (Signed)
Procedure Name: General with mask airway Date/Time: 01/08/2020 10:58 AM Performed by: [name], MD Oxygen Delivery Method: Ambu bag Induction Type: IV induction Dental Injury: Teeth and Oropharynx as per pre-operative assessment

## 2020-01-08 NOTE — Progress Notes (Signed)
Morning EKG reviewed  Pts QTc remains stable ~470 ms.  Post DCCV ECG shows sinus brady at 44 bpm with QTc ~400-410 due to slow rates.   Continue Tikosyn 500 mcg BID.   Home tomorrow if QTc remains stable.  May need to adjust Toprol in setting of NSR/Brady. Follow for now.   Shirley Friar, PA-C  Pager: [phone]  01/08/2020 12:01 PM

## 2020-01-08 NOTE — Transfer of Care (Signed)
Immediate Anesthesia Transfer of Care Note  Patient: Frank Chase  Procedure(s) Performed: CARDIOVERSION (N/A )  Patient Location: Endoscopy Unit  Anesthesia Type:General  Level of Consciousness: drowsy  Airway & Oxygen Therapy: Patient Spontanous Breathing  Post-op Assessment: Report given to RN, Post -op Vital signs reviewed and stable and Patient moving all extremities  Post vital signs: Reviewed and stable  Last Vitals:  Vitals Value Taken Time  BP 125/84 01/08/20 1101  Temp    Pulse 48 01/08/20 1109  Resp 16 01/08/20 1109  SpO2 96 % 01/08/20 1109    Last Pain:  Vitals:   01/08/20 1043  TempSrc: Oral  PainSc: 0-No pain      Patients Stated Pain Goal: 0 (25/67/20 9198)  Complications: No complications documented.

## 2020-01-08 NOTE — Progress Notes (Addendum)
Electrophysiology Rounding Note  Patient Name: Frank Chase Date of Encounter: 01/08/2020  Primary Cardiologist: Frank Breeding, MD  Electrophysiologist: New to Dr. Curt Chase   Subjective   Pt remains in afib on Tikosyn 500 mcg BID   QTc from EKG last pm shows stable QTc at ~430  The patient is doing well today.  At this time, the patient denies chest pain, shortness of breath, or any new concerns.  Inpatient Medications    Scheduled Meds:  amLODipine  10 mg Oral QHS   dofetilide  500 mcg Oral BID   dorzolamide-timolol  1 drop Left Eye BID   irbesartan  300 mg Oral QPC supper   latanoprost  1 drop Both Eyes QHS   metFORMIN  500 mg Oral BID WC   metoprolol succinate  100 mg Oral Q supper   potassium chloride  10 mEq Oral Daily   rivaroxaban  20 mg Oral Q supper   sodium chloride flush  3 mL Intravenous Q12H   Continuous Infusions:  sodium chloride     magnesium sulfate bolus IVPB 2 g (01/08/20 0802)   PRN Meds: sodium chloride, sodium chloride flush   Vital Signs    Vitals:   01/07/20 1358 01/07/20 2057 01/08/20 0426 01/08/20 0433  BP: 129/86 133/86  112/87  Pulse: 65 93  69  Resp: 16 17  15   Temp: 97.7 F (36.5 C) 97.6 F (36.4 C)  98.1 F (36.7 C)  TempSrc: Oral Oral  Oral  SpO2: 100% 100%  97%  Weight:   105.1 kg     Intake/Output Summary (Last 24 hours) at 01/08/2020 0819 Last data filed at 01/07/2020 2125 Gross per 24 hour  Intake 563 ml  Output --  Net 563 ml   Filed Weights   01/07/20 0608 01/08/20 0426  Weight: 105.6 kg 105.1 kg    Physical Exam    GEN- The patient is well appearing, alert and oriented x 3 today.   Head- normocephalic, atraumatic Eyes-  Sclera clear, conjunctiva pink Ears- hearing intact Oropharynx- clear Neck- supple Lungs- Clear to ausculation bilaterally, normal work of breathing Heart- Regular rate and rhythm, no murmurs, rubs or gallops GI- soft, NT, ND, + BS Extremities- no clubbing, cyanosis, or  edema Skin- no rash or lesion Psych- euthymic mood, full affect Neuro- strength and sensation are intact  Labs    CBC No results for input(s): WBC, NEUTROABS, HGB, HCT, MCV, PLT in the last 72 hours. Basic Metabolic Panel Recent Labs    01/07/20 0600 01/08/20 0415  NA 139 137  K 3.7 3.8  CL 107 108  CO2 23 22  GLUCOSE 102* 105*  BUN 13 13  CREATININE 0.98 1.03  CALCIUM 8.7* 8.7*  MG 2.2 1.9    Potassium  Date/Time Value Ref Range Status  01/08/2020 04:15 AM 3.8 3.5 - 5.1 mmol/L Final   Magnesium  Date/Time Value Ref Range Status  01/08/2020 04:15 AM 1.9 1.7 - 2.4 mg/dL Final    Comment:    Performed at [facility], [place] [address] [place], [place] [address]    Telemetry    AF with variable rates 80-110/120s with activity (personally reviewed)  Radiology    No results found.   Patient Profile     [name] is a 69 y.o. male with a past medical history significant for persistent atrial fibrillation.  They were admitted for tikosyn load.   Assessment & Plan    Persistent atrial fibrillation  Pt remains in afib on Tikosyn 500 mcg BID  Continue Xarelto K 3.8 and Mg 1.9. Supp ordered. CHA2DS2VASC is at least 2  Plan on DCCV later this am.   For questions or updates, please contact [place] Please consult [email or web address] for contact info under Cardiology/STEMI.  Signed, Frank Friar, PA-C  01/08/2020, 8:19 AM   I have seen and examined this patient with Frank Chase.  Agree with above, note added to reflect my findings.  On exam, irregular, no murmurs.  Tolerating dofetilide.  Remains in atrial fibrillation.  We  plan for cardioversion   Frank Chase.  MD 01/08/2020 11:22 AM

## 2020-01-09 ENCOUNTER — Other Ambulatory Visit: Payer: Self-pay

## 2020-01-09 ENCOUNTER — Encounter (HOSPITAL_COMMUNITY): Payer: Self-pay | Admitting: Cardiovascular Disease

## 2020-01-09 LAB — BASIC METABOLIC PANEL
Anion gap: 9 (ref 5–15)
BUN: 11 mg/dL (ref 8–23)
CO2: 22 mmol/L (ref 22–32)
Calcium: 8.5 mg/dL — ABNORMAL LOW (ref 8.9–10.3)
Chloride: 107 mmol/L (ref 98–111)
Creatinine, Ser: 0.91 mg/dL (ref 0.61–1.24)
GFR calc Af Amer: >60 mL/min (ref >60)
GFR calc non Af Amer: >60 mL/min (ref >60)
Glucose, Bld: 108 mg/dL — ABNORMAL HIGH (ref 70–99)
Potassium: 3.9 mmol/L (ref 3.5–5.1)
Sodium: 138 mmol/L (ref 135–145)

## 2020-01-09 LAB — MAGNESIUM: Magnesium: 2.2 mg/dL (ref 1.7–2.4)

## 2020-01-09 MED ORDER — POTASSIUM CHLORIDE CRYS ER 20 MEQ PO TBCR
40.0000 meq | EXTENDED_RELEASE_TABLET | Freq: Once | ORAL | Status: AC
  Administered 2020-01-09: 40 meq via ORAL
  Filled 2020-01-09: qty 2

## 2020-01-09 MED ORDER — DOFETILIDE 500 MCG PO CAPS: 500.0000 ug | ORAL_CAPSULE | Freq: Two times a day (BID) | ORAL | 0 refills | Status: DC

## 2020-01-09 NOTE — Progress Notes (Signed)
Evening EKG reviewed  Shows remains in NSR at 63 bpm with stable QTc at ~454 ms.  Continue Tikosyn 500 mcg BID.   K 3.9 and Mg 2.2. Supp K.   Plan for home today if QTc remains stable.    Shirley Friar, PA-C  Pager: [phone]  01/09/2020 9:12 AM

## 2020-01-09 NOTE — Progress Notes (Signed)
Pharmacy: Dofetilide (Tikosyn) - Follow Up Assessment and Electrolyte Replacement  Pharmacy consulted to assist in monitoring and replacing electrolytes in this 69 y.o. male admitted on 01/06/2020 undergoing dofetilide initiation. First dofetilide dose: 01/07/19 @ 2017  Labs:    Component Value Date/Time   K 3.9 01/09/2020 0510   MG 2.2 01/09/2020 0510     Plan: Potassium: K 3.8-3.9:  Give KCl 40 mEq po x1   Magnesium: Mg > 2: No additional supplementation needed   Pt has required additional potassium supplementation daily, consider increasing 10 mEq KCl from home regimen to 20 mEq daily.   Thank you for allowing pharmacy to participate in this patient's care   Rebbeca Paul, PharmD PGY1 Pharmacy Resident 01/09/2020 7:08 AM  Please check [email or web address] for unit-specific pharmacy phone numbers.

## 2020-01-09 NOTE — Care Management Important Message (Signed)
Important Message  Patient Details  Name: KHALED HERDA MRN: [id number] Date of Birth: [birth year]   Medicare Important Message Given:  Yes     Shelda Altes 01/09/2020, 9:24 AM

## 2020-01-10 DIAGNOSIS — M109 Gout, unspecified: Secondary | ICD-10-CM | POA: Diagnosis not present

## 2020-01-14 DIAGNOSIS — I10 Essential (primary) hypertension: Secondary | ICD-10-CM | POA: Diagnosis not present

## 2020-01-14 DIAGNOSIS — E785 Hyperlipidemia, unspecified: Secondary | ICD-10-CM | POA: Diagnosis not present

## 2020-01-14 DIAGNOSIS — Z6834 Body mass index (BMI) 34.0-34.9, adult: Secondary | ICD-10-CM | POA: Diagnosis not present

## 2020-01-14 DIAGNOSIS — I4891 Unspecified atrial fibrillation: Secondary | ICD-10-CM | POA: Diagnosis not present

## 2020-01-14 DIAGNOSIS — E1165 Type 2 diabetes mellitus with hyperglycemia: Secondary | ICD-10-CM | POA: Diagnosis not present

## 2020-01-14 DIAGNOSIS — M79674 Pain in right toe(s): Secondary | ICD-10-CM | POA: Diagnosis not present

## 2020-01-16 ENCOUNTER — Encounter (HOSPITAL_COMMUNITY): Payer: Self-pay | Admitting: Physician Assistant

## 2020-01-16 ENCOUNTER — Other Ambulatory Visit: Payer: Self-pay

## 2020-01-16 ENCOUNTER — Ambulatory Visit (HOSPITAL_COMMUNITY)
Admit: 2020-01-16 | Discharge: 2020-01-16 | Disposition: A | Discharge status: Home / Self Care | Payer: Medicare HMO | Source: Ambulatory Visit | Attending: Physician Assistant | Admitting: Physician Assistant

## 2020-01-16 VITALS — BP 110/74 | HR 57 | Ht 70.0 in | Wt 231.8 lb

## 2020-01-16 DIAGNOSIS — I4819 Other persistent atrial fibrillation: Secondary | ICD-10-CM

## 2020-01-16 DIAGNOSIS — E119 Type 2 diabetes mellitus without complications: Secondary | ICD-10-CM | POA: Diagnosis not present

## 2020-01-16 DIAGNOSIS — Z87891 Personal history of nicotine dependence: Secondary | ICD-10-CM | POA: Diagnosis not present

## 2020-01-16 DIAGNOSIS — Z7901 Long term (current) use of anticoagulants: Secondary | ICD-10-CM | POA: Diagnosis not present

## 2020-01-16 DIAGNOSIS — Z6833 Body mass index (BMI) 33.0-33.9, adult: Secondary | ICD-10-CM | POA: Diagnosis not present

## 2020-01-16 DIAGNOSIS — Z7984 Long term (current) use of oral hypoglycemic drugs: Secondary | ICD-10-CM | POA: Diagnosis not present

## 2020-01-16 DIAGNOSIS — E669 Obesity, unspecified: Secondary | ICD-10-CM | POA: Insufficient documentation

## 2020-01-16 DIAGNOSIS — Z79899 Other long term (current) drug therapy: Secondary | ICD-10-CM | POA: Diagnosis not present

## 2020-01-16 DIAGNOSIS — I10 Essential (primary) hypertension: Secondary | ICD-10-CM | POA: Diagnosis not present

## 2020-01-16 DIAGNOSIS — D6869 Other thrombophilia: Secondary | ICD-10-CM | POA: Insufficient documentation

## 2020-01-16 LAB — BASIC METABOLIC PANEL
Anion gap: 11 (ref 5–15)
BUN: 11 mg/dL (ref 8–23)
CO2: 21 mmol/L — ABNORMAL LOW (ref 22–32)
Calcium: 9.7 mg/dL (ref 8.9–10.3)
Chloride: 101 mmol/L (ref 98–111)
Creatinine, Ser: 1.04 mg/dL (ref 0.61–1.24)
GFR calc Af Amer: >60 mL/min (ref >60)
GFR calc non Af Amer: >60 mL/min (ref >60)
Glucose, Bld: 122 mg/dL — ABNORMAL HIGH (ref 70–99)
Potassium: 4.6 mmol/L (ref 3.5–5.1)
Sodium: 133 mmol/L — ABNORMAL LOW (ref 135–145)

## 2020-01-16 LAB — MAGNESIUM: Magnesium: 2 mg/dL (ref 1.7–2.4)

## 2020-01-16 MED ORDER — DOFETILIDE 500 MCG PO CAPS: 500.0000 ug | ORAL_CAPSULE | Freq: Two times a day (BID) | ORAL | 3 refills | Status: DC

## 2020-01-16 MED ORDER — OLMESARTAN MEDOXOMIL 40 MG PO TABS: 40.0000 mg | ORAL_TABLET | Freq: Every day | ORAL | 3 refills | Status: DC

## 2020-01-16 MED ORDER — POTASSIUM CHLORIDE ER 10 MEQ PO TBCR: 10.0000 meq | EXTENDED_RELEASE_TABLET | Freq: Every day | ORAL | 3 refills | Status: DC

## 2020-01-16 NOTE — Progress Notes (Signed)
Primary Care Physician: Ocie Doyne., MD (Inactive) Primary Cardiologist: Dr Percival Spanish  Primary Electrophysiologist: Dr Curt Bears Referring Physician: Dr Olga Millers Frank Chase is a 69 y.o. male with a history of HTN, DM and persistent atrial fibrillation who presents for follow up in the [facility]. The patient was initially diagnosed with atrial fibrillation 04/2019 incidentally by the patient's wife who noted an irregular heart beat. He presented to his PCP who confirmed afib on ECG. He was started on Xarelto and underwent DCCV on 05/12/19. Patient did well until follow up 11/2019 when he noted weakness and SOB with exertion and ECG showed he was back in afib. Patient is on Xarelto for a CHADS2VASC score of 3. Dr Percival Spanish recommended dofetilide at that time. He denies any missed doses of anticoagulation in the last 3 weeks.   On follow up today, patient is s/p dofetilide loading 7/20-7/23/21. He underwent DCCV on 01/08/20. Patient reports he feels well since leaving the hospital with more energy. He is tolerating the medication. He denies bleeding issues on anticoagulation.   Today, he denies symptoms of palpitations, chest pain, orthopnea, PND, lower extremity edema, presyncope, syncope, snoring, daytime somnolence, bleeding, or neurologic sequela. The patient is tolerating medications without difficulties and is otherwise without complaint today.    Atrial Fibrillation Risk Factors:  he does not have symptoms or diagnosis of sleep apnea. he does not have a history of rheumatic fever.   he has a BMI of Body mass index is 33.26 kg/m.Marland Kitchen Filed Weights   01/16/20 1123  Weight: (!) 105.1 kg    Family History  Problem Relation Age of Onset  . Alzheimer's disease Mother      Atrial Fibrillation Management history:  Previous antiarrhythmic drugs: dofetilide Previous cardioversions: 05/12/19, 01/08/20 Previous ablations: none CHADS2VASC score:  3 Anticoagulation history: Xarelto   Past Medical History:  Diagnosis Date  . Atrial fibrillation ([place])   . Diabetes mellitus without complication ([place])   . Hypertension    Past Surgical History:  Procedure Laterality Date  . CARDIOVERSION N/A 05/12/2019   Procedure: CARDIOVERSION;  Surgeon: Acie Fredrickson Wonda Cheng, MD;  Location: [facility] ENDOSCOPY;  Service: Cardiovascular;  Laterality: N/A;  . CARDIOVERSION N/A 01/08/2020   Procedure: CARDIOVERSION;  Surgeon: Geralynn Rile, MD;  Location: [place];  Service: Cardiovascular;  Laterality: N/A;  . WRIST SURGERY      Current Outpatient Medications  Medication Sig Dispense Refill  . amLODipine (NORVASC) 10 MG tablet Take 1 tablet (10 mg total) by mouth at bedtime. 90 tablet 3  . COSOPT 22.3-6.8 MG/ML ophthalmic solution Place 1 drop into the left eye 2 (two) times daily.    Marland Kitchen dofetilide (TIKOSYN) 500 MCG capsule Take 1 capsule (500 mcg total) by mouth 2 (two) times daily. 60 capsule 0  . Glucosamine-Chondroitin (COSAMIN DS PO) Take 2 tablets by mouth at bedtime.    . metFORMIN (GLUCOPHAGE) 500 MG tablet Take by mouth 2 (two) times daily with a meal.    . MITIGARE 0.6 MG CAPS     . olmesartan (BENICAR) 40 MG tablet Take 1 tablet (40 mg total) by mouth daily. 30 tablet 6  . potassium chloride (KLOR-CON) 10 MEQ tablet Take 1 tablet (10 mEq total) by mouth daily. 30 tablet 3  . traMADol (ULTRAM) 50 MG tablet     . Travoprost, BAK Free, (TRAVATAN) 0.004 % SOLN ophthalmic solution Place 1 drop into both eyes at bedtime.     . TRUE  METRIX BLOOD GLUCOSE TEST test strip     . XARELTO 20 MG TABS tablet Take 20 mg by mouth every evening.      No current facility-administered medications for this encounter.    No Known Allergies  Social History   Socioeconomic History  . Marital status: Married    Spouse name: Not on file  . Number of children: Not on file  . Years of education: Not on file  . Highest education level: Not on file   Occupational History  . Not on file  Tobacco Use  . Smoking status: Former Smoker    Types: Cigarettes    Quit date: 1978    Years since quitting: 43.6  . Smokeless tobacco: Former Systems developer    Quit date: 1985  Vaping Use  . Vaping Use: Never used  Substance and Sexual Activity  . Alcohol use: Yes    Comment: social  . Drug use: Never  . Sexual activity: Not on file  Other Topics Concern  . 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     Social Determinants of Health   Financial Resource Strain:   . Difficulty of Paying Living Expenses:   Food Insecurity:   . Worried About Charity fundraiser in the Last Year:   . Arboriculturist in the Last Year:   Transportation Needs:   . Film/video editor (Medical):   Marland Kitchen Lack of Transportation (Non-Medical):   Physical Activity:   . Days of Exercise per Week:   . Minutes of Exercise per Session:   Stress:   . Feeling of Stress :   Social Connections:   . Frequency of Communication with Friends and Family:   . Frequency of Social Gatherings with Friends and Family:   . Attends Religious Services:   . Active Member of Clubs or Organizations:   . Attends Archivist Meetings:   Marland Kitchen Marital Status:   Intimate Partner Violence:   . Fear of Current or Ex-Partner:   . Emotionally Abused:   Marland Kitchen Physically Abused:   . Sexually Abused:      ROS- All systems are reviewed and negative except as per the HPI above.  Physical Exam: Vitals:   01/16/20 1123  BP: 110/74  Pulse: 57  Weight: (!) 105.1 kg  Height: 5\' 10"  (1.778 m)    GEN- The patient is well appearing obese male, alert and oriented x 3 today.   HEENT-head normocephalic, atraumatic, sclera clear, conjunctiva pink, hearing intact, trachea midline. Lungs- Clear to ausculation bilaterally, normal work of breathing Heart- Regular rate and rhythm, no murmurs, rubs or gallops  GI- soft, NT, ND, + BS Extremities- no  clubbing, cyanosis, or edema MS- no significant deformity or atrophy Skin- no rash or lesion Psych- euthymic mood, full affect Neuro- strength and sensation are intact   Wt Readings from Last 3 Encounters:  01/16/20 (!) 105.1 kg  01/09/20 (!) 105.2 kg  01/06/20 106.6 kg    EKG today demonstrates SB HR 57, PR 168, QRS 94, QTc 430  Echo 04/17/19 demonstrated  Severely dilated LV, EF 55-60%, normal LA size  Epic records are reviewed at length today  CHA2DS2-VASc Score = 3  The patient's score is based upon: CHF History: 0 HTN History: 1 Age : 1 Diabetes History: 1 Stroke History: 0 Vascular Disease History: 0 Gender: 0      ASSESSMENT AND PLAN: 1.  Persistent Atrial Fibrillation (ICD10:  I48.19) The patient's CHA2DS2-VASc score is 3, indicating a 3.2% annual risk of stroke.   S/p dofetilide loading 7/20-7/23/21. S/p DCCV on 01/08/20. Patient appears to be maintaining SR. Continue dofetilide 500 mcg BID. QT stable. Check bmet/mag Continue Xarelto 20 mg daily with no missed doses for at least 4 weeks post DCCV.  2. Secondary Hypercoagulable State (ICD10:  D68.69) The patient is at significant risk for stroke/thromboembolism based upon his CHA2DS2-VASc Score of 3.  Continue Rivaroxaban (Xarelto).   3. Obesity Body mass index is 33.26 kg/m. Lifestyle modification was discussed and encouraged including regular physical activity and weight reduction.  4. HTN Stable, no changes today.   Follow up with Dr Percival Spanish and Dr Curt Bears as scheduled. AF clinic in 4 months.    [facility] [address] [place], [place] [address] [phone] 01/16/2020 11:49 AM

## 2020-01-16 NOTE — Addendum Note (Signed)
Encounter addended by: Hinda Kehr, CMA on: 01/16/2020 11:55 AM  Actions taken: Pharmacy for encounter modified, Order list changed

## 2020-01-28 DIAGNOSIS — M109 Gout, unspecified: Secondary | ICD-10-CM | POA: Diagnosis not present

## 2020-01-28 DIAGNOSIS — Z6833 Body mass index (BMI) 33.0-33.9, adult: Secondary | ICD-10-CM | POA: Diagnosis not present

## 2020-02-05 DIAGNOSIS — E669 Obesity, unspecified: Secondary | ICD-10-CM | POA: Insufficient documentation

## 2020-02-05 NOTE — Progress Notes (Signed)
Cardiology Office Note   Date:  02/06/2020   ID:  Frank Chase 02-Oct-1950, MRN [id number]  PCP:  Frank Chase., Chase (Inactive)  Cardiologist:   Minus Breeding, Chase   Chief Complaint  Patient presents with  . Atrial Fibrillation     History of Present Illness: Frank Chase is a 69 y.o. male who is referred by Frank Chase., Chase (Inactive) for evaluation of atrial fib.  He is status post DCCV.   He had recurrent atrial fib.   He had Tikosyn and DCCV.   Since then he has done quite well.  The patient denies any new symptoms such as chest discomfort, neck or arm discomfort. There has been no new shortness of breath, PND or orthopnea. There have been no reported palpitations, presyncope or syncope.   Past Medical History:  Diagnosis Date  . Atrial fibrillation ([place])   . Diabetes mellitus without complication ([place])   . Hypertension     Past Surgical History:  Procedure Laterality Date  . CARDIOVERSION N/A 05/12/2019   Procedure: CARDIOVERSION;  Surgeon: Frank Chase;  Location: [facility] ENDOSCOPY;  Service: Cardiovascular;  Laterality: N/A;  . CARDIOVERSION N/A 01/08/2020   Procedure: CARDIOVERSION;  Surgeon: Frank Chase;  Location: [place];  Service: Cardiovascular;  Laterality: N/A;  . WRIST SURGERY       Current Outpatient Medications  Medication Sig Dispense Refill  . amLODipine (NORVASC) 10 MG tablet Take 1 tablet (10 mg total) by mouth at bedtime. 90 tablet 3  . COSOPT 22.3-6.8 MG/ML ophthalmic solution Place 1 drop into the left eye 2 (two) times daily.    Marland Kitchen dofetilide (TIKOSYN) 500 MCG capsule Take 1 capsule (500 mcg total) by mouth 2 (two) times daily. 180 capsule 3  . Glucosamine-Chondroitin (COSAMIN DS PO) Take 2 tablets by mouth at bedtime.    . metFORMIN (GLUCOPHAGE) 500 MG tablet Take by mouth 2 (two) times daily with a meal.    . olmesartan (BENICAR) 40 MG tablet Take 1 tablet (40 mg total) by mouth daily. 90 tablet 3  . potassium  chloride (KLOR-CON) 10 MEQ tablet Take 1 tablet (10 mEq total) by mouth daily. 90 tablet 3  . traMADol (ULTRAM) 50 MG tablet     . Travoprost, BAK Free, (TRAVATAN) 0.004 % SOLN ophthalmic solution Place 1 drop into both eyes at bedtime.     . TRUE METRIX BLOOD GLUCOSE TEST test strip     . XARELTO 20 MG TABS tablet Take 1 tablet (20 mg total) by mouth every evening. 90 tablet 3   No current facility-administered medications for this visit.    Allergies:   Patient has no known allergies.     ROS:  Please see the history of present illness.   Otherwise, review of systems are positive for none.   All other systems are reviewed and negative.    PHYSICAL EXAM: VS:  BP 114/68   Pulse (!) 54   Ht 5\' 10"  (1.778 m)   Wt 227 lb 3.2 oz (103.1 kg)   SpO2 99%   BMI 32.60 kg/m  , BMI Body mass index is 32.6 kg/m. GENERAL:  Well appearing HEENT:  Pupils equal round and reactive, fundi not visualized, oral mucosa unremarkable NECK:  No jugular venous distention, waveform within normal limits, carotid upstroke brisk and symmetric, no bruits, no thyromegaly LYMPHATICS:  No cervical, inguinal adenopathy LUNGS:  Clear to auscultation bilaterally BACK:  No CVA tenderness CHEST:  Unremarkable HEART:  PMI not displaced or sustained,S1 and S2 within normal limits, no S3,  no clicks, no rubs, no murmurs, irregular  ABD:  Flat, positive bowel sounds normal in frequency in pitch, no bruits, no rebound, no guarding, no midline pulsatile mass, no hepatomegaly, no splenomegaly EXT:  2 plus pulses throughout, no edema, no cyanosis no clubbing SKIN:  No rashes no nodules NEURO:  Cranial nerves II through XII grossly intact, motor grossly intact throughout PSYCH:  Cognitively intact, oriented to person place and time   EKG:  EKG is   ordered today. Atrial fibrillation, rate 53, axis within normal limits, intervals within normal limits, no acute ST changes.   Recent Labs: 05/07/2019: Hemoglobin 16.6;  Platelets 218 01/16/2020: BUN 11; Creatinine, Ser 1.04; Magnesium 2.0; Potassium 4.6; Sodium 133    Lipid Panel No results found for: CHOL, TRIG, HDL, CHOLHDL, VLDL, LDLCALC, LDLDIRECT    Wt Readings from Last 3 Encounters:  02/06/20 227 lb 3.2 oz (103.1 kg)  01/16/20 (!) 231 lb 12.8 oz (105.1 kg)  01/09/20 (!) 231 lb 14.4 oz (105.2 kg)      Other studies Reviewed: Additional studies/ records that were reviewed today include: Labs Review of the above records demonstrates:  Please see elsewhere in the note.     ASSESSMENT AND PLAN:   Persistent Atrial Fibrillation:    He is tolerating medications and anticoagulation.  He is maintaining sinus rhythm.  He is up-to-date with normal potassium and magnesium.  EKG demonstrates normal QT interval.  No change in therapy.  He and I had a discussion about pluses and minuses of continuing the DOAC and I would support continuing to use this but he would prefer this to lower his risk of stroke.  Obesity:    He has lost weight and he continues to do this.  No change in therapy.  HTN: Blood pressures well controlled.  No change in therapy.  Covid Education: He has had his Covid vaccine and we did talk about the booster.   Current medicines are reviewed at length with the patient today.  The patient does not have concerns regarding medicines.  The following changes have been made:  None  Labs/ tests ordered today include:   Orders Placed This Encounter  Procedures  . EKG 12-Lead     Disposition:   FU with me in 6 months.     Signed, Minus Breeding, Chase  02/06/2020 12:10 PM    [facility]

## 2020-02-05 NOTE — Telephone Encounter (Signed)
Patient had office visit with Dr. Percival Spanish on 12/16/19. Will close this encounter.

## 2020-02-06 ENCOUNTER — Ambulatory Visit: Payer: Medicare HMO | Admitting: Cardiology

## 2020-02-06 ENCOUNTER — Encounter: Payer: Self-pay | Admitting: Cardiology

## 2020-02-06 ENCOUNTER — Other Ambulatory Visit: Payer: Self-pay

## 2020-02-06 VITALS — BP 114/68 | HR 54 | Ht 70.0 in | Wt 227.2 lb

## 2020-02-06 DIAGNOSIS — I10 Essential (primary) hypertension: Secondary | ICD-10-CM

## 2020-02-06 DIAGNOSIS — E669 Obesity, unspecified: Secondary | ICD-10-CM | POA: Diagnosis not present

## 2020-02-06 DIAGNOSIS — I480 Paroxysmal atrial fibrillation: Secondary | ICD-10-CM

## 2020-02-06 DIAGNOSIS — Z7189 Other specified counseling: Secondary | ICD-10-CM

## 2020-02-06 MED ORDER — XARELTO 20 MG PO TABS: 20.0000 mg | ORAL_TABLET | Freq: Every evening | ORAL | 3 refills | Status: DC

## 2020-02-06 NOTE — Patient Instructions (Signed)
Medication Instructions:  °No changes °*If you need a refill on your cardiac medications before your next appointment, please call your pharmacy* ° °Lab Work: °None ordered this visit ° °Testing/Procedures: °None ordered this visit ° °Follow-Up: °At [facility], you and your health needs are our priority.  As part of our continuing mission to provide you with exceptional heart care, we have created designated Provider Care Teams.  These Care Teams include your primary Cardiologist (physician) and Advanced Practice Providers (APPs -  Physician Assistants and Nurse Practitioners) who all work together to provide you with the care you need, when you need it. ° °Your next appointment:   °6 month(s)  °You will receive a reminder letter in the mail two months in advance. If you don't receive a letter, please call our office to schedule the follow-up appointment. ° °The format for your next appointment:   °In Person ° °Provider:   °James Hochrein, MD ° °

## 2020-02-09 DIAGNOSIS — H401123 Primary open-angle glaucoma, left eye, severe stage: Secondary | ICD-10-CM | POA: Diagnosis not present

## 2020-02-09 DIAGNOSIS — E119 Type 2 diabetes mellitus without complications: Secondary | ICD-10-CM | POA: Diagnosis not present

## 2020-02-09 DIAGNOSIS — H40011 Open angle with borderline findings, low risk, right eye: Secondary | ICD-10-CM | POA: Diagnosis not present

## 2020-02-10 ENCOUNTER — Telehealth: Payer: Self-pay | Admitting: *Deleted

## 2020-02-10 NOTE — Telephone Encounter (Signed)
   [facility] Pre-operative Risk Assessment      Request for surgical clearance:  1. What type of surgery is being performed?  COLONOSCOPY   2. When is this surgery scheduled? 03/03/20  3. What type of clearance is required (medical clearance vs. Pharmacy clearance to hold med vs. Both)? BOTH   4. Are there any medications that need to be held prior to surgery and how long?[place]  5. Practice name and name of physician performing surgery? [facility] ; DR Haugen  6. What is the office phone number? [phone]   7.   What is the office fax number? [place]  8.   Anesthesia type (None, local, MAC, general) ? PROPOFOL SEDATION   Raiford Simmonds 02/10/2020, 3:01 PM  _________________________________________________________________   (provider comments below)

## 2020-02-10 NOTE — Telephone Encounter (Signed)
Please comment on anticoagulation for the listed procedure and then I will contact the patient for clearance.  Kerin Ransom PA-C 02/10/2020 3:41 PM

## 2020-02-10 NOTE — Telephone Encounter (Signed)
Patient with diagnosis of afib on Xarelto for anticoagulation.    Procedure: COLONOSCOPY  Date of procedure: 03/03/20  CHADS2-VASc score of  3 (HTN, AGE, DM2)  CrCl 79 ml/min  Per office protocol, patient can hold Xarelto for 1-2 days prior to procedure.

## 2020-02-11 NOTE — Telephone Encounter (Signed)
   Primary Cardiologist: Minus Breeding, MD  Chart reviewed as part of pre-operative protocol coverage. Given past medical history and time since last visit, based on ACC/AHA guidelines, TOBEN ACUNA would be at acceptable risk for the planned procedure without further cardiovascular testing.   OK to hold Xarelto 1-2 days pre op, resume as soon as safe post op.  I will route this recommendation to the requesting party via Epic fax function and remove from pre-op pool.  Please call with questions.  Kerin Ransom, PA-C 02/11/2020, 9:07 AM

## 2020-02-13 DIAGNOSIS — M109 Gout, unspecified: Secondary | ICD-10-CM | POA: Diagnosis not present

## 2020-02-13 DIAGNOSIS — Z6833 Body mass index (BMI) 33.0-33.9, adult: Secondary | ICD-10-CM | POA: Diagnosis not present

## 2020-02-16 ENCOUNTER — Other Ambulatory Visit: Payer: Self-pay | Admitting: Legal Medicine

## 2020-02-17 DIAGNOSIS — Z20828 Contact with and (suspected) exposure to other viral communicable diseases: Secondary | ICD-10-CM | POA: Diagnosis not present

## 2020-02-17 DIAGNOSIS — J3489 Other specified disorders of nose and nasal sinuses: Secondary | ICD-10-CM | POA: Diagnosis not present

## 2020-02-20 ENCOUNTER — Encounter: Payer: Self-pay | Admitting: Legal Medicine

## 2020-02-24 ENCOUNTER — Ambulatory Visit: Payer: Medicare HMO | Admitting: Cardiology

## 2020-03-03 DIAGNOSIS — Z1211 Encounter for screening for malignant neoplasm of colon: Secondary | ICD-10-CM | POA: Diagnosis not present

## 2020-03-03 DIAGNOSIS — K5730 Diverticulosis of large intestine without perforation or abscess without bleeding: Secondary | ICD-10-CM | POA: Diagnosis not present

## 2020-04-01 DIAGNOSIS — Z23 Encounter for immunization: Secondary | ICD-10-CM | POA: Diagnosis not present

## 2020-04-01 DIAGNOSIS — I4891 Unspecified atrial fibrillation: Secondary | ICD-10-CM | POA: Diagnosis not present

## 2020-04-01 DIAGNOSIS — I10 Essential (primary) hypertension: Secondary | ICD-10-CM | POA: Diagnosis not present

## 2020-04-01 DIAGNOSIS — E785 Hyperlipidemia, unspecified: Secondary | ICD-10-CM | POA: Diagnosis not present

## 2020-04-01 DIAGNOSIS — Z6833 Body mass index (BMI) 33.0-33.9, adult: Secondary | ICD-10-CM | POA: Diagnosis not present

## 2020-04-01 DIAGNOSIS — E1165 Type 2 diabetes mellitus with hyperglycemia: Secondary | ICD-10-CM | POA: Diagnosis not present

## 2020-05-18 ENCOUNTER — Ambulatory Visit (HOSPITAL_COMMUNITY): Payer: Medicare HMO | Admitting: Physician Assistant

## 2020-06-01 ENCOUNTER — Other Ambulatory Visit: Payer: Self-pay | Admitting: Cardiology

## 2020-06-08 DIAGNOSIS — Z01818 Encounter for other preprocedural examination: Secondary | ICD-10-CM | POA: Diagnosis not present

## 2020-06-08 DIAGNOSIS — H2512 Age-related nuclear cataract, left eye: Secondary | ICD-10-CM | POA: Diagnosis not present

## 2020-06-15 ENCOUNTER — Telehealth: Payer: Self-pay | Admitting: Cardiology

## 2020-06-15 NOTE — Telephone Encounter (Signed)
Spoke with [facility] pharmacy, who wanted to follow up on a fax sent to Dr. Antoine Poche regarding this patient. Requested that fax be re-sent and confirmed fax number for our office.

## 2020-06-15 NOTE — Telephone Encounter (Signed)
    Frank Chase with [facility] calling, she said they noticed the pt has a gap in therapy and she would like to speak with a nurse to clarify

## 2020-06-30 DIAGNOSIS — H2512 Age-related nuclear cataract, left eye: Secondary | ICD-10-CM | POA: Diagnosis not present

## 2020-07-07 DIAGNOSIS — Z6833 Body mass index (BMI) 33.0-33.9, adult: Secondary | ICD-10-CM | POA: Diagnosis not present

## 2020-07-07 DIAGNOSIS — M109 Gout, unspecified: Secondary | ICD-10-CM | POA: Diagnosis not present

## 2020-07-07 DIAGNOSIS — I10 Essential (primary) hypertension: Secondary | ICD-10-CM | POA: Diagnosis not present

## 2020-07-07 DIAGNOSIS — E1165 Type 2 diabetes mellitus with hyperglycemia: Secondary | ICD-10-CM | POA: Diagnosis not present

## 2020-07-07 DIAGNOSIS — I4891 Unspecified atrial fibrillation: Secondary | ICD-10-CM | POA: Diagnosis not present

## 2020-07-07 DIAGNOSIS — N529 Male erectile dysfunction, unspecified: Secondary | ICD-10-CM | POA: Diagnosis not present

## 2020-07-07 DIAGNOSIS — E785 Hyperlipidemia, unspecified: Secondary | ICD-10-CM | POA: Diagnosis not present

## 2020-07-14 DIAGNOSIS — H2511 Age-related nuclear cataract, right eye: Secondary | ICD-10-CM | POA: Diagnosis not present

## 2020-07-20 DIAGNOSIS — H25811 Combined forms of age-related cataract, right eye: Secondary | ICD-10-CM | POA: Diagnosis not present

## 2020-08-03 ENCOUNTER — Other Ambulatory Visit: Payer: Self-pay

## 2020-08-03 ENCOUNTER — Encounter: Payer: Self-pay | Admitting: Cardiology

## 2020-08-03 ENCOUNTER — Ambulatory Visit (INDEPENDENT_AMBULATORY_CARE_PROVIDER_SITE_OTHER): Payer: Medicare HMO | Admitting: Cardiology

## 2020-08-03 VITALS — BP 130/78 | HR 55 | Ht 70.0 in | Wt 232.0 lb

## 2020-08-03 DIAGNOSIS — I480 Paroxysmal atrial fibrillation: Secondary | ICD-10-CM

## 2020-08-03 DIAGNOSIS — Z0100 Encounter for examination of eyes and vision without abnormal findings: Secondary | ICD-10-CM | POA: Diagnosis not present

## 2020-08-03 DIAGNOSIS — I4819 Other persistent atrial fibrillation: Secondary | ICD-10-CM | POA: Diagnosis not present

## 2020-08-03 DIAGNOSIS — I10 Essential (primary) hypertension: Secondary | ICD-10-CM | POA: Diagnosis not present

## 2020-08-03 MED ORDER — DOFETILIDE 500 MCG PO CAPS: 500.0000 ug | ORAL_CAPSULE | Freq: Two times a day (BID) | ORAL | 1 refills | Status: DC

## 2020-08-03 NOTE — Progress Notes (Signed)
Cardiology Office Note   Date:  08/03/2020   ID:  Frank, Chase 05/16/1951, MRN [id number]  PCP:  Ocie Doyne., MD  Cardiologist:   Minus Breeding, MD   No chief complaint on file.    History of Present Illness: Frank Chase is a 70 y.o. male who is referred by Ocie Doyne., MD for evaluation of atrial fib.  He is status post DCCV.   He had recurrent atrial fib.   He had Tikosyn and DCCV.   Since I last saw him he has done well.  He has had none of the fatigue that heralded his atrial fibrillation. The patient denies any new symptoms such as chest discomfort, neck or arm discomfort. There has been no new shortness of breath, PND or orthopnea. There have been no reported palpitations, presyncope or syncope.    Past Medical History:  Diagnosis Date  . Atrial fibrillation ([place])   . Diabetes mellitus without complication ([place])   . Hypertension     Past Surgical History:  Procedure Laterality Date  . CARDIOVERSION N/A 05/12/2019   Procedure: CARDIOVERSION;  Surgeon: Acie Fredrickson Wonda Cheng, MD;  Location: [facility] ENDOSCOPY;  Service: Cardiovascular;  Laterality: N/A;  . CARDIOVERSION N/A 01/08/2020   Procedure: CARDIOVERSION;  Surgeon: Geralynn Rile, MD;  Location: [place];  Service: Cardiovascular;  Laterality: N/A;  . WRIST SURGERY       Current Outpatient Medications  Medication Sig Dispense Refill  . amLODipine (NORVASC) 10 MG tablet TAKE 1 TABLET AT BEDTIME 90 tablet 3  . COSOPT 22.3-6.8 MG/ML ophthalmic solution Place 1 drop into the left eye 2 (two) times daily.    . Glucosamine-Chondroitin (COSAMIN DS PO) Take 2 tablets by mouth at bedtime.    . metFORMIN (GLUCOPHAGE) 500 MG tablet Take by mouth 2 (two) times daily with a meal.    . olmesartan (BENICAR) 40 MG tablet Take 1 tablet (40 mg total) by mouth daily. 90 tablet 3  . potassium chloride (KLOR-CON) 10 MEQ tablet Take 1 tablet (10 mEq total) by mouth daily. 90 tablet 3  . rosuvastatin (CRESTOR) 5  MG tablet Take 5 mg by mouth 1 day or 1 dose.    . traMADol (ULTRAM) 50 MG tablet     . Travoprost, BAK Free, (TRAVATAN) 0.004 % SOLN ophthalmic solution Place 1 drop into both eyes at bedtime.     . TRUE METRIX BLOOD GLUCOSE TEST test strip     . XARELTO 20 MG TABS tablet Take 1 tablet (20 mg total) by mouth every evening. 90 tablet 3  . dofetilide (TIKOSYN) 500 MCG capsule Take 1 capsule (500 mcg total) by mouth 2 (two) times daily. 20 capsule 1   No current facility-administered medications for this visit.    Allergies:   Patient has no known allergies.     ROS:  Please see the history of present illness.   Otherwise, review of systems are positive for none.   All other systems are reviewed and negative.    PHYSICAL EXAM: VS:  BP 130/78   Pulse (!) 55   Ht 5\' 10"  (1.778 m)   Wt 232 lb (105.2 kg)   SpO2 95%   BMI 33.29 kg/m  , BMI Body mass index is 33.29 kg/m. GENERAL:  Well appearing NECK:  No jugular venous distention, waveform within normal limits, carotid upstroke brisk and symmetric, no bruits, no thyromegaly LUNGS:  Clear to auscultation bilaterally CHEST:  Unremarkable HEART:  PMI  not displaced or sustained,S1 and S2 within normal limits, no S3, no S4, no clicks, no rubs, no murmurs ABD:  Flat, positive bowel sounds normal in frequency in pitch, no bruits, no rebound, no guarding, no midline pulsatile mass, no hepatomegaly, no splenomegaly EXT:  2 plus pulses throughout, no edema, no cyanosis no clubbing   EKG:  EKG is  not ordered today. Atrial fibrillation, rate 55, axis within normal limits, intervals within normal limits, no acute ST changes.   Recent Labs: 01/16/2020: BUN 11; Creatinine, Ser 1.04; Magnesium 2.0; Potassium 4.6; Sodium 133    Lipid Panel No results found for: CHOL, TRIG, HDL, CHOLHDL, VLDL, LDLCALC, LDLDIRECT    Wt Readings from Last 3 Encounters:  08/03/20 232 lb (105.2 kg)  02/06/20 227 lb 3.2 oz (103.1 kg)  01/16/20 (!) 231 lb 12.8 oz  (105.1 kg)      Other studies Reviewed: Additional studies/ records that were reviewed today include: EP records Review of the above records demonstrates:  Please see elsewhere in the note.     ASSESSMENT AND PLAN:   Persistent Atrial Fibrillation:      The patient seems to be maintaining sinus rhythm.  Frank Chase has a CHA2DS2 - VASc score of 3.  And I have discussed the risk benefits of continued DOAC and he will continue.  Also he has been given instructions to have his magnesium checked along with his routine basic metabolic profile when he has routine labs for his primary doctor.  I would be happy to review these.  Obesity:     We have talked about weight loss with diet and exercise.  HTN: Blood pressure is controlled.  No change in therapy.  Current medicines are reviewed at length with the patient today.  The patient does not have concerns regarding medicines.  The following changes have been made:  None  Labs/ tests ordered today include:   Orders Placed This Encounter  Procedures  . EKG 12-Lead     Disposition:   FU with me in 12 months.     Signed, Minus Breeding, MD  08/03/2020 4:58 PM    [facility]

## 2020-08-03 NOTE — Patient Instructions (Signed)
Medication Instructions:  No changes  Follow-Up: At [facility], you and your health needs are our priority.  As part of our continuing mission to provide you with exceptional heart care, we have created designated Provider Care Teams.  These Care Teams include your primary Cardiologist (physician) and Advanced Practice Providers (APPs -  Physician Assistants and Nurse Practitioners) who all work together to provide you with the care you need, when you need it.  Your next appointment:   12 month(s)  You will receive a reminder letter in the mail two months in advance. If you don't receive a letter, please call our office to schedule the follow-up appointment.  The format for your next appointment:   In Person  Provider:   Minus Breeding, MD

## 2020-10-08 DIAGNOSIS — I10 Essential (primary) hypertension: Secondary | ICD-10-CM | POA: Diagnosis not present

## 2020-10-08 DIAGNOSIS — I4891 Unspecified atrial fibrillation: Secondary | ICD-10-CM | POA: Diagnosis not present

## 2020-10-08 DIAGNOSIS — M109 Gout, unspecified: Secondary | ICD-10-CM | POA: Diagnosis not present

## 2020-10-08 DIAGNOSIS — E785 Hyperlipidemia, unspecified: Secondary | ICD-10-CM | POA: Diagnosis not present

## 2020-10-08 DIAGNOSIS — Z125 Encounter for screening for malignant neoplasm of prostate: Secondary | ICD-10-CM | POA: Diagnosis not present

## 2020-10-08 DIAGNOSIS — Z6833 Body mass index (BMI) 33.0-33.9, adult: Secondary | ICD-10-CM | POA: Diagnosis not present

## 2020-10-08 DIAGNOSIS — J301 Allergic rhinitis due to pollen: Secondary | ICD-10-CM | POA: Diagnosis not present

## 2020-11-10 DIAGNOSIS — H40011 Open angle with borderline findings, low risk, right eye: Secondary | ICD-10-CM | POA: Diagnosis not present

## 2020-11-25 ENCOUNTER — Other Ambulatory Visit (HOSPITAL_COMMUNITY): Payer: Self-pay | Admitting: Physician Assistant

## 2020-11-25 ENCOUNTER — Telehealth: Payer: Self-pay | Admitting: Cardiology

## 2020-11-25 MED ORDER — DOFETILIDE 500 MCG PO CAPS: 500.0000 ug | ORAL_CAPSULE | Freq: Two times a day (BID) | ORAL | 1 refills | Status: DC

## 2020-11-25 NOTE — Telephone Encounter (Signed)
This is Dr. Hochrein's pt. °

## 2020-11-25 NOTE — Telephone Encounter (Signed)
*  STAT* If patient is at the pharmacy, call can be transferred to refill team.   1. Which medications need to be refilled? (please list name of each medication and dose if known) dofetilide (TIKOSYN) 500 MCG capsule  2. Which pharmacy/location (including street and city if local pharmacy) is medication to be sent to? [place], [place]  3. Do they need a 30 day or 90 day supply?  90 day supply

## 2020-11-26 ENCOUNTER — Telehealth (HOSPITAL_COMMUNITY): Payer: Self-pay

## 2020-11-26 ENCOUNTER — Telehealth: Payer: Self-pay

## 2020-11-26 NOTE — Telephone Encounter (Signed)
Contacted patient regarding refills on his Tikosyn medication on 6/9. Advised patient he needs to schedule to come in for a follow up appointment at the A-fib clinic to receive refills on his Tikosyn. He stated he would contact the clinic back regarding his medication to let us know who has been filling his medication. He understood he would need to come in for a follow up per [facility]. Consulted with patient and he verbalized understanding.

## 2020-11-26 NOTE — Telephone Encounter (Signed)
Called patient, as he did not respond to my mychart. He states he is not in any irregular rhythm right now, it just comes and goes. Patient has not missed any medications. His HR was good, denies chest pains and other symptoms.  Patient states that he will call on call service and seek ED attention for any new or worsening symptoms.  No other changes at this time.  Patient will continue to monitor and let us know.

## 2020-12-03 DIAGNOSIS — H4032X3 Glaucoma secondary to eye trauma, left eye, severe stage: Secondary | ICD-10-CM | POA: Diagnosis not present

## 2020-12-13 ENCOUNTER — Other Ambulatory Visit (HOSPITAL_COMMUNITY): Payer: Self-pay | Admitting: Physician Assistant

## 2021-01-11 DIAGNOSIS — Z6833 Body mass index (BMI) 33.0-33.9, adult: Secondary | ICD-10-CM | POA: Diagnosis not present

## 2021-01-11 DIAGNOSIS — E785 Hyperlipidemia, unspecified: Secondary | ICD-10-CM | POA: Diagnosis not present

## 2021-01-11 DIAGNOSIS — M109 Gout, unspecified: Secondary | ICD-10-CM | POA: Diagnosis not present

## 2021-01-11 DIAGNOSIS — Z1331 Encounter for screening for depression: Secondary | ICD-10-CM | POA: Diagnosis not present

## 2021-01-11 DIAGNOSIS — I4891 Unspecified atrial fibrillation: Secondary | ICD-10-CM | POA: Diagnosis not present

## 2021-01-11 DIAGNOSIS — I10 Essential (primary) hypertension: Secondary | ICD-10-CM | POA: Diagnosis not present

## 2021-01-11 DIAGNOSIS — Z139 Encounter for screening, unspecified: Secondary | ICD-10-CM | POA: Diagnosis not present

## 2021-01-11 DIAGNOSIS — Z9181 History of falling: Secondary | ICD-10-CM | POA: Diagnosis not present

## 2021-02-10 DIAGNOSIS — E119 Type 2 diabetes mellitus without complications: Secondary | ICD-10-CM | POA: Diagnosis not present

## 2021-02-10 DIAGNOSIS — H40011 Open angle with borderline findings, low risk, right eye: Secondary | ICD-10-CM | POA: Diagnosis not present

## 2021-02-18 ENCOUNTER — Other Ambulatory Visit: Payer: Self-pay | Admitting: Cardiology

## 2021-02-18 NOTE — Telephone Encounter (Signed)
Prescription refill request for Xarelto received.  Indication:AFIB Last office visit:HOCHREIN 08/03/20 Weight:105.2KG Age:29M Scr:1.09 01/11/21 CrCl:92.5

## 2021-04-20 DIAGNOSIS — N200 Calculus of kidney: Secondary | ICD-10-CM | POA: Diagnosis not present

## 2021-04-20 DIAGNOSIS — E782 Mixed hyperlipidemia: Secondary | ICD-10-CM | POA: Diagnosis not present

## 2021-04-20 DIAGNOSIS — E1169 Type 2 diabetes mellitus with other specified complication: Secondary | ICD-10-CM | POA: Diagnosis not present

## 2021-04-20 DIAGNOSIS — I700 Atherosclerosis of aorta: Secondary | ICD-10-CM | POA: Diagnosis not present

## 2021-04-20 DIAGNOSIS — R351 Nocturia: Secondary | ICD-10-CM | POA: Diagnosis not present

## 2021-04-20 DIAGNOSIS — I4819 Other persistent atrial fibrillation: Secondary | ICD-10-CM | POA: Diagnosis not present

## 2021-04-20 DIAGNOSIS — N281 Cyst of kidney, acquired: Secondary | ICD-10-CM | POA: Diagnosis not present

## 2021-04-20 DIAGNOSIS — E1139 Type 2 diabetes mellitus with other diabetic ophthalmic complication: Secondary | ICD-10-CM | POA: Diagnosis not present

## 2021-04-20 DIAGNOSIS — I10 Essential (primary) hypertension: Secondary | ICD-10-CM | POA: Diagnosis not present

## 2021-04-20 DIAGNOSIS — R3129 Other microscopic hematuria: Secondary | ICD-10-CM | POA: Diagnosis not present

## 2021-04-20 DIAGNOSIS — K5730 Diverticulosis of large intestine without perforation or abscess without bleeding: Secondary | ICD-10-CM | POA: Diagnosis not present

## 2021-04-26 DIAGNOSIS — R310 Gross hematuria: Secondary | ICD-10-CM | POA: Diagnosis not present

## 2021-04-26 DIAGNOSIS — R311 Benign essential microscopic hematuria: Secondary | ICD-10-CM | POA: Diagnosis not present

## 2021-04-28 ENCOUNTER — Other Ambulatory Visit: Payer: Self-pay | Admitting: Cardiology

## 2021-05-26 DIAGNOSIS — J218 Acute bronchiolitis due to other specified organisms: Secondary | ICD-10-CM | POA: Diagnosis not present

## 2021-05-26 DIAGNOSIS — Z6834 Body mass index (BMI) 34.0-34.9, adult: Secondary | ICD-10-CM | POA: Diagnosis not present

## 2021-07-07 DIAGNOSIS — N200 Calculus of kidney: Secondary | ICD-10-CM | POA: Diagnosis not present

## 2021-07-07 DIAGNOSIS — R311 Benign essential microscopic hematuria: Secondary | ICD-10-CM | POA: Diagnosis not present

## 2021-07-20 DIAGNOSIS — Z20822 Contact with and (suspected) exposure to covid-19: Secondary | ICD-10-CM | POA: Diagnosis not present

## 2021-08-05 ENCOUNTER — Other Ambulatory Visit: Payer: Self-pay | Admitting: Cardiology

## 2021-08-05 MED ORDER — DOFETILIDE 500 MCG PO CAPS: 500.0000 ug | ORAL_CAPSULE | Freq: Two times a day (BID) | ORAL | 1 refills | Status: DC

## 2021-08-05 MED ORDER — OLMESARTAN MEDOXOMIL 40 MG PO TABS: 40.0000 mg | ORAL_TABLET | Freq: Every day | ORAL | 3 refills | Status: DC

## 2021-08-05 MED ORDER — XARELTO 20 MG PO TABS: 20.0000 mg | ORAL_TABLET | Freq: Every evening | ORAL | 0 refills | Status: DC

## 2021-08-05 MED ORDER — POTASSIUM CHLORIDE ER 10 MEQ PO TBCR: 10.0000 meq | EXTENDED_RELEASE_TABLET | Freq: Every day | ORAL | 3 refills | Status: DC

## 2021-08-05 NOTE — Telephone Encounter (Signed)
°*  STAT* If patient is at the pharmacy, call can be transferred to refill team.   1. Which medications need to be refilled? (please list name of each medication and dose if known) need new prescription, changing pharmacy  Xarelto, Olmesartan, Potassium Dofetilide  2. Which pharmacy/location (including street and city if local pharmacy) is medication to be sent to? [facility] [place]  3. Do they need a 30 day or 90 day supply? 90 days and refills

## 2021-08-05 NOTE — Telephone Encounter (Signed)
Prescription refill request for Xarelto received.  Indication:Afib Last office visit:Needs OV Weight:105.2 kg Age:71 Scr:1.0 CrCl:100.82 ml/min  Prescription refilled

## 2021-08-09 ENCOUNTER — Telehealth: Payer: Self-pay | Admitting: Cardiology

## 2021-08-09 MED ORDER — AMLODIPINE BESYLATE 10 MG PO TABS: 10.0000 mg | ORAL_TABLET | Freq: Every day | ORAL | 0 refills | Status: DC

## 2021-08-09 NOTE — Telephone Encounter (Signed)
° ° °*  STAT* If patient is at the pharmacy, call can be transferred to refill team.   1. Which medications need to be refilled? (please list name of each medication and dose if known) amLODipine (NORVASC) 10 MG tablet  2. Which pharmacy/location (including street and city if local pharmacy) is medication to be sent to? [place], [place]  3. Do they need a 30 day or 90 day supply? 90 days

## 2021-08-09 NOTE — Telephone Encounter (Signed)
Appointment has been made. RX has been sent for 30 days.

## 2021-08-10 NOTE — Progress Notes (Signed)
Cardiology Office Note   Date:  08/12/2021   ID:  Frank Chase, Frank Chase Apr 12, 1951, MRN [id number]  PCP:  Frank Chase., MD  Cardiologist:   Minus Breeding, MD   Chief Complaint  Patient presents with   Atrial Fibrillation     History of Present Illness: Frank Chase is a 71 y.o. male who is referred by Frank Chase., MD for evaluation of atrial fib.  He is status post DCCV.   He had recurrent atrial fib.   He had Tikosyn and DCCV.   Since I last saw him he has had no cardiovascular complaints.  He has not felt any atrial fibrillation.  He has not had any chest pressure, neck or arm discomfort.  He said no weight gain or edema.  He does have a little fatigue the other day when he does a lot of work around this.  But he was not having any symptoms during the heavy work.  He was just fatigued the next day.  He has had a kidney stone and is considering having some therapy of this.   Past Medical History:  Diagnosis Date   Atrial fibrillation ([place])    Diabetes mellitus without complication ([place])    Hypertension     Past Surgical History:  Procedure Laterality Date   CARDIOVERSION N/A 05/12/2019   Procedure: CARDIOVERSION;  Surgeon: Nahser, Wonda Cheng, MD;  Location: [facility] ENDOSCOPY;  Service: Cardiovascular;  Laterality: N/A;   CARDIOVERSION N/A 01/08/2020   Procedure: CARDIOVERSION;  Surgeon: Geralynn Rile, MD;  Location: [place];  Service: Cardiovascular;  Laterality: N/A;   WRIST SURGERY       Current Outpatient Medications  Medication Sig Dispense Refill   amLODipine (NORVASC) 10 MG tablet Take 1 tablet (10 mg total) by mouth at bedtime. 30 tablet 0   COSOPT 22.3-6.8 MG/ML ophthalmic solution Place 1 drop into the left eye 2 (two) times daily.     dofetilide (TIKOSYN) 500 MCG capsule Take 1 capsule (500 mcg total) by mouth 2 (two) times daily. 180 capsule 1   Glucosamine-Chondroitin (COSAMIN DS PO) Take 2 tablets by mouth at bedtime.     metFORMIN  (GLUCOPHAGE) 500 MG tablet Take by mouth 2 (two) times daily with a meal.     olmesartan (BENICAR) 40 MG tablet Take 1 tablet (40 mg total) by mouth daily. 90 tablet 3   potassium chloride (KLOR-CON) 10 MEQ tablet Take 1 tablet (10 mEq total) by mouth daily. 90 tablet 3   Travoprost, BAK Free, (TRAVATAN) 0.004 % SOLN ophthalmic solution Place 1 drop into both eyes at bedtime.      TRUE METRIX BLOOD GLUCOSE TEST test strip      XARELTO 20 MG TABS tablet Take 1 tablet (20 mg total) by mouth every evening. 60 tablet 0   No current facility-administered medications for this visit.    Allergies:   Patient has no known allergies.     ROS:  Please see the history of present illness.   Otherwise, review of systems are positive for none.   All other systems are reviewed and negative.    PHYSICAL EXAM: VS:  BP (!) 142/82    Pulse 60    Ht 5\' 10"  (1.778 m)    Wt 234 lb (106.1 kg)    SpO2 98%    BMI 33.58 kg/m  , BMI Body mass index is 33.58 kg/m. GENERAL:  Well appearing NECK:  No jugular venous distention, waveform within  normal limits, carotid upstroke brisk and symmetric, no bruits, no thyromegaly LUNGS:  Clear to auscultation bilaterally CHEST:  Unremarkable HEART:  PMI not displaced or sustained,S1 and S2 within normal limits, no S3, no S4, no clicks, no rubs, no murmurs ABD:  Flat, positive bowel sounds normal in frequency in pitch, no bruits, no rebound, no guarding, no midline pulsatile mass, no hepatomegaly, no splenomegaly EXT:  2 plus pulses throughout, no edema, no cyanosis no clubbing   EKG:  EKG is  ordered today. Atrial fibrillation, rate 60, axis within normal limits, intervals within normal limits, no acute ST changes.   Recent Labs: No results found for requested labs within last 8760 hours.    Lipid Panel No results found for: CHOL, TRIG, HDL, CHOLHDL, VLDL, LDLCALC, LDLDIRECT    Wt Readings from Last 3 Encounters:  08/12/21 234 lb (106.1 kg)  08/03/20 232 lb  (105.2 kg)  02/06/20 227 lb 3.2 oz (103.1 kg)      Other studies Reviewed: Additional studies/ records that were reviewed today include: None Review of the above records demonstrates:  Please see elsewhere in the note.     ASSESSMENT AND PLAN:   Persistent Atrial Fibrillation:      The patient seems to be maintaining sinus rhythm.  Frank Chase has a CHA2DS2 - VASc score of 3.  I will check labs today to include magnesium and potassium   Obesity:   We did talk about this again today and I suggested a 3 pound weight loss per month..     HTN: Blood pressure is mildly elevated but typically controlled.  Would like to control this better through weight loss.   Current medicines are reviewed at length with the patient today.  The patient does not have concerns regarding medicines.  The following changes have been made: None  Labs/ tests ordered today include:   Orders Placed This Encounter  Procedures   Basic metabolic panel   CBC   Magnesium   EKG 12-Lead     Disposition:   FU with me in 12 months.     Signed, Minus Breeding, MD  08/12/2021 8:26 AM    [facility]

## 2021-08-12 ENCOUNTER — Encounter: Payer: Self-pay | Admitting: Cardiology

## 2021-08-12 ENCOUNTER — Other Ambulatory Visit: Payer: Self-pay

## 2021-08-12 ENCOUNTER — Ambulatory Visit: Payer: Medicare HMO | Admitting: Cardiology

## 2021-08-12 VITALS — BP 142/82 | HR 60 | Ht 70.0 in | Wt 234.0 lb

## 2021-08-12 DIAGNOSIS — I4819 Other persistent atrial fibrillation: Secondary | ICD-10-CM | POA: Diagnosis not present

## 2021-08-12 DIAGNOSIS — I10 Essential (primary) hypertension: Secondary | ICD-10-CM

## 2021-08-12 LAB — CBC
Hematocrit: 47 % (ref 37.5–51.0)
Hemoglobin: 16 g/dL (ref 13.0–17.7)
MCH: 28.4 pg (ref 26.6–33.0)
MCHC: 34 g/dL (ref 31.5–35.7)
MCV: 83 fL (ref 79–97)
Platelets: 234 10*3/uL (ref 150–450)
RBC: 5.64 x10E6/uL (ref 4.14–5.80)
RDW: 12.6 % (ref 11.6–15.4)
WBC: 7.2 10*3/uL (ref 3.4–10.8)

## 2021-08-12 LAB — BASIC METABOLIC PANEL
BUN/Creatinine Ratio: 13 (ref 10–24)
BUN: 14 mg/dL (ref 8–27)
CO2: 24 mmol/L (ref 20–29)
Calcium: 9.4 mg/dL (ref 8.6–10.2)
Chloride: 102 mmol/L (ref 96–106)
Creatinine, Ser: 1.12 mg/dL (ref 0.76–1.27)
Glucose: 120 mg/dL — ABNORMAL HIGH (ref 70–99)
Potassium: 4.1 mmol/L (ref 3.5–5.2)
Sodium: 140 mmol/L (ref 134–144)
eGFR: 70 mL/min/{1.73_m2} (ref >59)

## 2021-08-12 LAB — MAGNESIUM: Magnesium: 1.9 mg/dL (ref 1.6–2.3)

## 2021-08-12 NOTE — Patient Instructions (Addendum)
Medication Instructions:  No Changes In Medications at this time.  *If you need a refill on your cardiac medications before your next appointment, please call your pharmacy*  Lab Work: BLOOD WORK TODAY  If you have labs (blood work) drawn today and your tests are completely normal, you will receive your results only by: [place] (if you have MyChart) OR A paper copy in the mail If you have any lab test that is abnormal or we need to change your treatment, we will call you to review the results.  Follow-Up: At [facility], you and your health needs are our priority.  As part of our continuing mission to provide you with exceptional heart care, we have created designated Provider Care Teams.  These Care Teams include your primary Cardiologist (physician) and Advanced Practice Providers (APPs -  Physician Assistants and Nurse Practitioners) who all work together to provide you with the care you need, when you need it.  Your next appointment:   1 year(s)  The format for your next appointment:   In Person  Provider:   Minus Breeding, MD

## 2021-08-16 ENCOUNTER — Telehealth: Payer: Self-pay | Admitting: *Deleted

## 2021-08-16 MED ORDER — MAGNESIUM OXIDE 400 MG PO CAPS: 1.0000 | ORAL_CAPSULE | Freq: Every day | ORAL | 3 refills | Status: AC

## 2021-08-16 NOTE — Telephone Encounter (Signed)
-----   Message from Minus Breeding, MD sent at 08/14/2021 11:55 AM EST ----- Mag is low.  Please prescribe magnesium 400 mg po daily.  Call Mr. Kretschmer with the results and send results to Ocie Doyne., MD

## 2021-08-16 NOTE — Telephone Encounter (Signed)
pt aware of results  New script sent to the pharmacy  

## 2021-08-17 DIAGNOSIS — H401123 Primary open-angle glaucoma, left eye, severe stage: Secondary | ICD-10-CM | POA: Diagnosis not present

## 2021-09-29 DIAGNOSIS — J020 Streptococcal pharyngitis: Secondary | ICD-10-CM | POA: Diagnosis not present

## 2021-09-29 DIAGNOSIS — Z6834 Body mass index (BMI) 34.0-34.9, adult: Secondary | ICD-10-CM | POA: Diagnosis not present

## 2021-09-29 DIAGNOSIS — B349 Viral infection, unspecified: Secondary | ICD-10-CM | POA: Diagnosis not present

## 2021-10-14 DIAGNOSIS — I10 Essential (primary) hypertension: Secondary | ICD-10-CM | POA: Diagnosis not present

## 2021-10-14 DIAGNOSIS — E1139 Type 2 diabetes mellitus with other diabetic ophthalmic complication: Secondary | ICD-10-CM | POA: Diagnosis not present

## 2021-10-14 DIAGNOSIS — E782 Mixed hyperlipidemia: Secondary | ICD-10-CM | POA: Diagnosis not present

## 2021-10-14 DIAGNOSIS — Z6833 Body mass index (BMI) 33.0-33.9, adult: Secondary | ICD-10-CM | POA: Diagnosis not present

## 2021-10-14 DIAGNOSIS — I4819 Other persistent atrial fibrillation: Secondary | ICD-10-CM | POA: Diagnosis not present

## 2021-10-24 ENCOUNTER — Other Ambulatory Visit: Payer: Self-pay | Admitting: Cardiology

## 2021-10-24 NOTE — Telephone Encounter (Signed)
Prescription refill request for Xarelto received.  ?Indication:Afib ?Last office visit:2/23 ?Weight:106.1 kg ?Age:71 ?Scr:1.1 ?CrCl:92.44 ml/min ? ?Prescription refilled ? ?

## 2021-11-15 DIAGNOSIS — R319 Hematuria, unspecified: Secondary | ICD-10-CM | POA: Diagnosis not present

## 2021-11-15 DIAGNOSIS — Z6834 Body mass index (BMI) 34.0-34.9, adult: Secondary | ICD-10-CM | POA: Diagnosis not present

## 2021-11-15 DIAGNOSIS — N3001 Acute cystitis with hematuria: Secondary | ICD-10-CM | POA: Diagnosis not present

## 2021-11-21 DIAGNOSIS — R109 Unspecified abdominal pain: Secondary | ICD-10-CM | POA: Diagnosis not present

## 2021-11-21 DIAGNOSIS — R3129 Other microscopic hematuria: Secondary | ICD-10-CM | POA: Diagnosis not present

## 2021-11-22 DIAGNOSIS — N201 Calculus of ureter: Secondary | ICD-10-CM | POA: Diagnosis not present

## 2021-11-22 DIAGNOSIS — R1031 Right lower quadrant pain: Secondary | ICD-10-CM | POA: Diagnosis not present

## 2021-11-25 DIAGNOSIS — Z7901 Long term (current) use of anticoagulants: Secondary | ICD-10-CM | POA: Diagnosis not present

## 2021-11-25 DIAGNOSIS — Z792 Long term (current) use of antibiotics: Secondary | ICD-10-CM | POA: Diagnosis not present

## 2021-11-25 DIAGNOSIS — I10 Essential (primary) hypertension: Secondary | ICD-10-CM | POA: Diagnosis not present

## 2021-11-25 DIAGNOSIS — E119 Type 2 diabetes mellitus without complications: Secondary | ICD-10-CM | POA: Diagnosis not present

## 2021-11-25 DIAGNOSIS — Z79899 Other long term (current) drug therapy: Secondary | ICD-10-CM | POA: Diagnosis not present

## 2021-11-25 DIAGNOSIS — N202 Calculus of kidney with calculus of ureter: Secondary | ICD-10-CM | POA: Diagnosis not present

## 2021-11-25 DIAGNOSIS — Z7984 Long term (current) use of oral hypoglycemic drugs: Secondary | ICD-10-CM | POA: Diagnosis not present

## 2021-11-25 DIAGNOSIS — N400 Enlarged prostate without lower urinary tract symptoms: Secondary | ICD-10-CM | POA: Diagnosis not present

## 2021-11-25 DIAGNOSIS — N200 Calculus of kidney: Secondary | ICD-10-CM | POA: Diagnosis not present

## 2021-11-25 DIAGNOSIS — Z01818 Encounter for other preprocedural examination: Secondary | ICD-10-CM | POA: Diagnosis not present

## 2021-11-25 DIAGNOSIS — N201 Calculus of ureter: Secondary | ICD-10-CM | POA: Diagnosis not present

## 2021-11-29 DIAGNOSIS — N23 Unspecified renal colic: Secondary | ICD-10-CM | POA: Diagnosis not present

## 2021-12-06 DIAGNOSIS — N201 Calculus of ureter: Secondary | ICD-10-CM | POA: Diagnosis not present

## 2021-12-06 DIAGNOSIS — R1031 Right lower quadrant pain: Secondary | ICD-10-CM | POA: Diagnosis not present

## 2021-12-06 DIAGNOSIS — N200 Calculus of kidney: Secondary | ICD-10-CM | POA: Diagnosis not present

## 2022-01-06 DIAGNOSIS — N201 Calculus of ureter: Secondary | ICD-10-CM | POA: Diagnosis not present

## 2022-01-06 DIAGNOSIS — R1031 Right lower quadrant pain: Secondary | ICD-10-CM | POA: Diagnosis not present

## 2022-01-16 DIAGNOSIS — Z1331 Encounter for screening for depression: Secondary | ICD-10-CM | POA: Diagnosis not present

## 2022-01-16 DIAGNOSIS — Z139 Encounter for screening, unspecified: Secondary | ICD-10-CM | POA: Diagnosis not present

## 2022-01-16 DIAGNOSIS — I10 Essential (primary) hypertension: Secondary | ICD-10-CM | POA: Diagnosis not present

## 2022-01-16 DIAGNOSIS — Z6834 Body mass index (BMI) 34.0-34.9, adult: Secondary | ICD-10-CM | POA: Diagnosis not present

## 2022-01-16 DIAGNOSIS — E1139 Type 2 diabetes mellitus with other diabetic ophthalmic complication: Secondary | ICD-10-CM | POA: Diagnosis not present

## 2022-01-16 DIAGNOSIS — Z9181 History of falling: Secondary | ICD-10-CM | POA: Diagnosis not present

## 2022-01-16 DIAGNOSIS — E782 Mixed hyperlipidemia: Secondary | ICD-10-CM | POA: Diagnosis not present

## 2022-01-16 DIAGNOSIS — I4819 Other persistent atrial fibrillation: Secondary | ICD-10-CM | POA: Diagnosis not present

## 2022-02-22 DIAGNOSIS — H401123 Primary open-angle glaucoma, left eye, severe stage: Secondary | ICD-10-CM | POA: Diagnosis not present

## 2022-03-30 ENCOUNTER — Other Ambulatory Visit: Payer: Self-pay | Admitting: Cardiology

## 2022-03-30 NOTE — Telephone Encounter (Signed)
Refill request

## 2022-04-18 DIAGNOSIS — Z23 Encounter for immunization: Secondary | ICD-10-CM | POA: Diagnosis not present

## 2022-04-18 DIAGNOSIS — Z125 Encounter for screening for malignant neoplasm of prostate: Secondary | ICD-10-CM | POA: Diagnosis not present

## 2022-04-18 DIAGNOSIS — I10 Essential (primary) hypertension: Secondary | ICD-10-CM | POA: Diagnosis not present

## 2022-04-18 DIAGNOSIS — E782 Mixed hyperlipidemia: Secondary | ICD-10-CM | POA: Diagnosis not present

## 2022-04-18 DIAGNOSIS — Z6834 Body mass index (BMI) 34.0-34.9, adult: Secondary | ICD-10-CM | POA: Diagnosis not present

## 2022-04-18 DIAGNOSIS — I4819 Other persistent atrial fibrillation: Secondary | ICD-10-CM | POA: Diagnosis not present

## 2022-04-18 DIAGNOSIS — E1139 Type 2 diabetes mellitus with other diabetic ophthalmic complication: Secondary | ICD-10-CM | POA: Diagnosis not present

## 2022-05-15 DIAGNOSIS — N201 Calculus of ureter: Secondary | ICD-10-CM | POA: Diagnosis not present

## 2022-05-15 DIAGNOSIS — R109 Unspecified abdominal pain: Secondary | ICD-10-CM | POA: Diagnosis not present

## 2022-06-01 ENCOUNTER — Other Ambulatory Visit: Payer: Self-pay | Admitting: Cardiology

## 2022-06-01 DIAGNOSIS — I4819 Other persistent atrial fibrillation: Secondary | ICD-10-CM

## 2022-06-01 MED ORDER — XARELTO 20 MG PO TABS: 20.0000 mg | ORAL_TABLET | Freq: Every evening | ORAL | 5 refills | Status: DC

## 2022-06-01 NOTE — Telephone Encounter (Signed)
Xarelto '20mg'$  refill request received. Pt is 71 years old, weight-106.1kg, Crea-1.12 on 08/12/2021, last seen by Dr. Percival Spanish on 08/12/2021, Diagnosis-Afib, CrCl- 89.47 mL/min; Dose is appropriate based on dosing criteria. Will send in refill to requested pharmacy.     Sent in 60 tabs with 5 refills called to correct and resent 30 tabs with 5 refills. The pharmacy will discontinue 60 tabs and 5 refills per conversation with the pharmacist.

## 2022-07-01 ENCOUNTER — Other Ambulatory Visit: Payer: Self-pay | Admitting: Cardiology

## 2022-07-19 DIAGNOSIS — S91311A Laceration without foreign body, right foot, initial encounter: Secondary | ICD-10-CM | POA: Diagnosis not present

## 2022-07-19 DIAGNOSIS — Z6832 Body mass index (BMI) 32.0-32.9, adult: Secondary | ICD-10-CM | POA: Diagnosis not present

## 2022-07-19 DIAGNOSIS — I4819 Other persistent atrial fibrillation: Secondary | ICD-10-CM | POA: Diagnosis not present

## 2022-07-19 DIAGNOSIS — I10 Essential (primary) hypertension: Secondary | ICD-10-CM | POA: Diagnosis not present

## 2022-07-19 DIAGNOSIS — E1139 Type 2 diabetes mellitus with other diabetic ophthalmic complication: Secondary | ICD-10-CM | POA: Diagnosis not present

## 2022-07-19 DIAGNOSIS — E782 Mixed hyperlipidemia: Secondary | ICD-10-CM | POA: Diagnosis not present

## 2022-07-27 ENCOUNTER — Encounter (HOSPITAL_COMMUNITY): Payer: Self-pay | Admitting: *Deleted

## 2022-08-10 DIAGNOSIS — R1031 Right lower quadrant pain: Secondary | ICD-10-CM | POA: Diagnosis not present

## 2022-08-10 DIAGNOSIS — N201 Calculus of ureter: Secondary | ICD-10-CM | POA: Diagnosis not present

## 2022-08-10 DIAGNOSIS — Z87442 Personal history of urinary calculi: Secondary | ICD-10-CM | POA: Diagnosis not present

## 2022-08-18 DIAGNOSIS — S0501XA Injury of conjunctiva and corneal abrasion without foreign body, right eye, initial encounter: Secondary | ICD-10-CM | POA: Diagnosis not present

## 2022-08-28 DIAGNOSIS — Z6833 Body mass index (BMI) 33.0-33.9, adult: Secondary | ICD-10-CM | POA: Diagnosis not present

## 2022-08-28 DIAGNOSIS — M109 Gout, unspecified: Secondary | ICD-10-CM | POA: Diagnosis not present

## 2022-08-31 NOTE — Progress Notes (Signed)
Cardiology Office Note   Date:  09/01/2022   ID:  Fowler, Guzzardo Jul 18, 1950, MRN [id number]  PCP:  Ocie Doyne., MD  Cardiologist:   Minus Breeding, MD   Chief Complaint  Patient presents with   Atrial Fibrillation     History of Present Illness: Frank Chase is a 72 y.o. male who is referred by Ocie Doyne., MD for evaluation of atrial fib.  He is status post DCCV.   He had recurrent atrial fib.   He had Tikosyn and DCCV.   Since I last saw him he has done very well. The patient denies any new symptoms such as chest discomfort, neck or arm discomfort. There has been no new shortness of breath, PND or orthopnea. There have been no reported palpitations, presyncope or syncope.    Past Medical History:  Diagnosis Date   Atrial fibrillation ([place])    Diabetes mellitus without complication ([place])    Hypertension     Past Surgical History:  Procedure Laterality Date   CARDIOVERSION N/A 05/12/2019   Procedure: CARDIOVERSION;  Surgeon: Nahser, Wonda Cheng, MD;  Location: [facility] ENDOSCOPY;  Service: Cardiovascular;  Laterality: N/A;   CARDIOVERSION N/A 01/08/2020   Procedure: CARDIOVERSION;  Surgeon: Geralynn Rile, MD;  Location: [place];  Service: Cardiovascular;  Laterality: N/A;   WRIST SURGERY       Current Outpatient Medications  Medication Sig Dispense Refill   amLODipine (NORVASC) 10 MG tablet TAKE ONE TABLET BY MOUTH AT BEDTIME 90 tablet 3   colchicine 0.6 MG tablet Take 0.6 mg by mouth 2 (two) times daily as needed.     COMBIGAN 0.2-0.5 % ophthalmic solution Place 1 drop into the left eye at bedtime.     dofetilide (TIKOSYN) 500 MCG capsule TAKE ONE CAPSULE BY MOUTH TWICE DAILY 180 capsule 1   FARXIGA 5 MG TABS tablet Take 5 mg by mouth daily.     Glucosamine-Chondroitin (COSAMIN DS PO) Take 1 tablet by mouth at bedtime.     Magnesium Oxide 400 MG CAPS Take 1 capsule (400 mg total) by mouth daily. 90 capsule 3   metFORMIN (GLUCOPHAGE) 500 MG  tablet Take by mouth 2 (two) times daily with a meal.     olmesartan (BENICAR) 40 MG tablet Take 1 tablet (40 mg total) by mouth daily. 90 tablet 3   potassium chloride (KLOR-CON) 10 MEQ tablet Take 1 tablet (10 mEq total) by mouth daily. 90 tablet 3   Travoprost, BAK Free, (TRAVATAN) 0.004 % SOLN ophthalmic solution Place 1 drop into both eyes at bedtime.      TRUE METRIX BLOOD GLUCOSE TEST test strip      XARELTO 20 MG TABS tablet Take 1 tablet (20 mg total) by mouth every evening. 30 tablet 5   No current facility-administered medications for this visit.    Allergies:   Patient has no known allergies.     ROS:  Please see the history of present illness.   Otherwise, review of systems are positive for none.   All other systems are reviewed and negative.    PHYSICAL EXAM: VS:  BP 136/82   Pulse (!) 53   Ht 5\' 10"  (1.778 m)   Wt 227 lb (103 kg)   SpO2 93%   BMI 32.57 kg/m  , BMI Body mass index is 32.57 kg/m. GENERAL:  Well appearing NECK:  No jugular venous distention, waveform within normal limits, carotid upstroke brisk and symmetric, no  bruits, no thyromegaly LUNGS:  Clear to auscultation bilaterally CHEST:  Unremarkable HEART:  PMI not displaced or sustained,S1 and S2 within normal limits, no S3, no S4, no clicks, no rubs, no murmurs ABD:  Flat, positive bowel sounds normal in frequency in pitch, no bruits, no rebound, no guarding, no midline pulsatile mass, no hepatomegaly, no splenomegaly EXT:  2 plus pulses throughout, no edema, no cyanosis no clubbing   EKG:  EKG is  ordered today. Atrial fibrillation, rate 53, axis within normal limits, intervals within normal limits, no acute ST changes.   Recent Labs: No results found for requested labs within last 365 days.    Lipid Panel No results found for: "CHOL", "TRIG", "HDL", "CHOLHDL", "VLDL", "LDLCALC", "LDLDIRECT"    Wt Readings from Last 3 Encounters:  09/01/22 227 lb (103 kg)  08/12/21 234 lb (106.1 kg)   08/03/20 232 lb (105.2 kg)      Other studies Reviewed: Additional studies/ records that were reviewed today include: Labs Review of the above records demonstrates:  Please see elsewhere in the note.     ASSESSMENT AND PLAN:   Persistent Atrial Fibrillation:      The patient seems to be maintaining sinus rhythm.  Mr. KAYIN MIZER has a CHA2DS2 - VASc score of 3.  No change in therapy.    HTN: Blood pressure is at target.  No change in therapy.    Current medicines are reviewed at length with the patient today.  The patient does not have concerns regarding medicines.  The following changes have been made: None  Labs/ tests ordered today include: None  Orders Placed This Encounter  Procedures   EKG 12-Lead     Disposition:   FU with me in 12 months.     Signed, Minus Breeding, MD  09/01/2022 4:21 PM    [place]

## 2022-09-01 ENCOUNTER — Ambulatory Visit: Payer: Medicare HMO | Attending: Cardiology | Admitting: Cardiology

## 2022-09-01 ENCOUNTER — Encounter: Payer: Self-pay | Admitting: Cardiology

## 2022-09-01 VITALS — BP 136/82 | HR 53 | Ht 70.0 in | Wt 227.0 lb

## 2022-09-01 DIAGNOSIS — I4819 Other persistent atrial fibrillation: Secondary | ICD-10-CM | POA: Diagnosis not present

## 2022-09-01 DIAGNOSIS — I10 Essential (primary) hypertension: Secondary | ICD-10-CM | POA: Diagnosis not present

## 2022-09-01 NOTE — Patient Instructions (Signed)
Medication Instructions:  Your physician recommends that you continue on your current medications as directed. Please refer to the Current Medication list given to you today.  *If you need a refill on your cardiac medications before your next appointment, please call your pharmacy*  Follow-Up: At [facility], you and your health needs are our priority.  As part of our continuing mission to provide you with exceptional heart care, we have created designated Provider Care Teams.  These Care Teams include your primary Cardiologist (physician) and Advanced Practice Providers (APPs -  Physician Assistants and Nurse Practitioners) who all work together to provide you with the care you need, when you need it.  We recommend signing up for the patient portal called "MyChart".  Sign up information is provided on this After Visit Summary.  MyChart is used to connect with patients for Virtual Visits (Telemedicine).  Patients are able to view lab/test results, encounter notes, upcoming appointments, etc.  Non-urgent messages can be sent to your provider as well.   To learn more about what you can do with MyChart, go to [email or web address]://www.mychart.com.    Your next appointment:   12 month(s)  Provider:   James Hochrein, MD     

## 2022-09-25 ENCOUNTER — Other Ambulatory Visit: Payer: Self-pay | Admitting: Cardiology

## 2022-09-25 DIAGNOSIS — H401123 Primary open-angle glaucoma, left eye, severe stage: Secondary | ICD-10-CM | POA: Diagnosis not present

## 2022-10-02 ENCOUNTER — Encounter: Payer: Self-pay | Admitting: Cardiology

## 2022-10-19 DIAGNOSIS — I10 Essential (primary) hypertension: Secondary | ICD-10-CM | POA: Diagnosis not present

## 2022-10-19 DIAGNOSIS — Z6833 Body mass index (BMI) 33.0-33.9, adult: Secondary | ICD-10-CM | POA: Diagnosis not present

## 2022-10-19 DIAGNOSIS — E1139 Type 2 diabetes mellitus with other diabetic ophthalmic complication: Secondary | ICD-10-CM | POA: Diagnosis not present

## 2022-10-19 DIAGNOSIS — I4819 Other persistent atrial fibrillation: Secondary | ICD-10-CM | POA: Diagnosis not present

## 2022-10-19 DIAGNOSIS — E782 Mixed hyperlipidemia: Secondary | ICD-10-CM | POA: Diagnosis not present

## 2022-10-20 LAB — LAB REPORT - SCANNED
A1c: 6.1
Albumin, Urine POC: 144.6
Creatinine, POC: 305.4 mg/dL
EGFR: 73
Microalb Creat Ratio: 47

## 2023-01-02 ENCOUNTER — Other Ambulatory Visit: Payer: Self-pay | Admitting: Cardiology

## 2023-01-02 DIAGNOSIS — I4819 Other persistent atrial fibrillation: Secondary | ICD-10-CM

## 2023-01-02 NOTE — Telephone Encounter (Signed)
Prescription refill request for Xarelto received.  Indication: AF Last office visit: 09/01/22  Daiva Nakayama MD Weight: 103kg Age: 72 Scr: 1.08 on 10/19/22  [facility] CrCl: 88.75  Based on above findings Xarelto 20mg  daily is the appropriate dose.  Refill approved.

## 2023-01-19 DIAGNOSIS — Z139 Encounter for screening, unspecified: Secondary | ICD-10-CM | POA: Diagnosis not present

## 2023-01-19 DIAGNOSIS — E1139 Type 2 diabetes mellitus with other diabetic ophthalmic complication: Secondary | ICD-10-CM | POA: Diagnosis not present

## 2023-01-19 DIAGNOSIS — Z6833 Body mass index (BMI) 33.0-33.9, adult: Secondary | ICD-10-CM | POA: Diagnosis not present

## 2023-01-19 DIAGNOSIS — R809 Proteinuria, unspecified: Secondary | ICD-10-CM | POA: Diagnosis not present

## 2023-01-19 DIAGNOSIS — E1129 Type 2 diabetes mellitus with other diabetic kidney complication: Secondary | ICD-10-CM | POA: Diagnosis not present

## 2023-01-19 DIAGNOSIS — I4819 Other persistent atrial fibrillation: Secondary | ICD-10-CM | POA: Diagnosis not present

## 2023-01-19 DIAGNOSIS — I10 Essential (primary) hypertension: Secondary | ICD-10-CM | POA: Diagnosis not present

## 2023-01-19 DIAGNOSIS — E782 Mixed hyperlipidemia: Secondary | ICD-10-CM | POA: Diagnosis not present

## 2023-02-14 DIAGNOSIS — N200 Calculus of kidney: Secondary | ICD-10-CM | POA: Diagnosis not present

## 2023-02-14 DIAGNOSIS — N201 Calculus of ureter: Secondary | ICD-10-CM | POA: Diagnosis not present

## 2023-02-14 DIAGNOSIS — N401 Enlarged prostate with lower urinary tract symptoms: Secondary | ICD-10-CM | POA: Diagnosis not present

## 2023-02-21 DIAGNOSIS — D582 Other hemoglobinopathies: Secondary | ICD-10-CM | POA: Diagnosis not present

## 2023-02-27 DIAGNOSIS — H401123 Primary open-angle glaucoma, left eye, severe stage: Secondary | ICD-10-CM | POA: Diagnosis not present

## 2023-03-20 DIAGNOSIS — R110 Nausea: Secondary | ICD-10-CM | POA: Diagnosis not present

## 2023-03-20 DIAGNOSIS — E119 Type 2 diabetes mellitus without complications: Secondary | ICD-10-CM | POA: Diagnosis not present

## 2023-03-20 DIAGNOSIS — R42 Dizziness and giddiness: Secondary | ICD-10-CM | POA: Diagnosis not present

## 2023-03-20 DIAGNOSIS — I10 Essential (primary) hypertension: Secondary | ICD-10-CM | POA: Diagnosis not present

## 2023-04-24 DIAGNOSIS — D582 Other hemoglobinopathies: Secondary | ICD-10-CM | POA: Diagnosis not present

## 2023-04-24 DIAGNOSIS — I10 Essential (primary) hypertension: Secondary | ICD-10-CM | POA: Diagnosis not present

## 2023-04-24 DIAGNOSIS — H9313 Tinnitus, bilateral: Secondary | ICD-10-CM | POA: Diagnosis not present

## 2023-04-24 DIAGNOSIS — E1139 Type 2 diabetes mellitus with other diabetic ophthalmic complication: Secondary | ICD-10-CM | POA: Diagnosis not present

## 2023-04-24 DIAGNOSIS — Z23 Encounter for immunization: Secondary | ICD-10-CM | POA: Diagnosis not present

## 2023-04-24 DIAGNOSIS — E1129 Type 2 diabetes mellitus with other diabetic kidney complication: Secondary | ICD-10-CM | POA: Diagnosis not present

## 2023-04-24 DIAGNOSIS — I4819 Other persistent atrial fibrillation: Secondary | ICD-10-CM | POA: Diagnosis not present

## 2023-04-24 DIAGNOSIS — Z6832 Body mass index (BMI) 32.0-32.9, adult: Secondary | ICD-10-CM | POA: Diagnosis not present

## 2023-04-24 DIAGNOSIS — R809 Proteinuria, unspecified: Secondary | ICD-10-CM | POA: Diagnosis not present

## 2023-04-24 DIAGNOSIS — E782 Mixed hyperlipidemia: Secondary | ICD-10-CM | POA: Diagnosis not present

## 2023-05-27 ENCOUNTER — Ambulatory Visit (HOSPITAL_BASED_OUTPATIENT_CLINIC_OR_DEPARTMENT_OTHER)
Admission: EM | Admit: 2023-05-27 | Discharge: 2023-05-27 | Disposition: A | Discharge status: Home / Self Care | Payer: Medicare HMO | Attending: Internal Medicine | Admitting: Internal Medicine

## 2023-05-27 ENCOUNTER — Encounter (HOSPITAL_BASED_OUTPATIENT_CLINIC_OR_DEPARTMENT_OTHER): Payer: Self-pay

## 2023-05-27 DIAGNOSIS — B9789 Other viral agents as the cause of diseases classified elsewhere: Secondary | ICD-10-CM | POA: Diagnosis not present

## 2023-05-27 DIAGNOSIS — I4819 Other persistent atrial fibrillation: Secondary | ICD-10-CM | POA: Insufficient documentation

## 2023-05-27 DIAGNOSIS — Z87891 Personal history of nicotine dependence: Secondary | ICD-10-CM | POA: Insufficient documentation

## 2023-05-27 DIAGNOSIS — J069 Acute upper respiratory infection, unspecified: Secondary | ICD-10-CM | POA: Insufficient documentation

## 2023-05-27 MED ORDER — BENZONATATE 100 MG PO CAPS: 100.0000 mg | ORAL_CAPSULE | Freq: Three times a day (TID) | ORAL | 0 refills | Status: DC

## 2023-05-27 NOTE — ED Triage Notes (Signed)
Cough, sinus congestion onset Friday morning. Denies fevers. Has been taking coricidin for symptoms with some relief.

## 2023-05-27 NOTE — ED Provider Notes (Signed)
[facility]    CSN: [id number] Arrival date & time: 05/27/23  1344      History   Chief Complaint Chief Complaint  Patient presents with   Cough   Nasal Congestion    HPI Frank Chase is a 72 y.o. male.   Frank Chase is a 72 y.o. male presenting for chief complaint of cough, congestion, sore throat, and fatigue that started 3 days ago on Friday May 25, 2023. Cough is productive with clear/yellow sputum. No N/V/D, abdominal pain, SOB, CP, palpitations, dizziness, or fevers/chills. Former smoker, denies drug use. Denies history of chronic respiratory problems.    Cough   Past Medical History:  Diagnosis Date   Atrial fibrillation (HCC)    Diabetes mellitus without complication (HCC)    Hypertension     Patient Active Problem List   Diagnosis Date Noted   Morbid obesity (HCC) 08/10/2021   Obesity (BMI 30.0-34.9) 02/05/2020   Secondary hypercoagulable state (HCC) 01/02/2020   PAF (paroxysmal atrial fibrillation) (HCC) 12/15/2019   Persistent atrial fibrillation (HCC) 05/28/2019   Essential hypertension 05/28/2019   Educated about COVID-19 virus infection 05/28/2019    Past Surgical History:  Procedure Laterality Date   CARDIOVERSION N/A 05/12/2019   Procedure: CARDIOVERSION;  Surgeon: Elease Hashimoto, Deloris Ping, MD;  Location: [facility] ENDOSCOPY;  Service: Cardiovascular;  Laterality: N/A;   CARDIOVERSION N/A 01/08/2020   Procedure: CARDIOVERSION;  Surgeon: Sande Rives, MD;  Location: [facility] ENDOSCOPY;  Service: Cardiovascular;  Laterality: N/A;   WRIST SURGERY         Home Medications    Prior to Admission medications   Medication Sig Start Date End Date Taking? Authorizing Provider  benzonatate (TESSALON) 100 MG capsule Take 1 capsule (100 mg total) by mouth every 8 (eight) hours. 05/27/23  Yes Carlisle Beers, FNP  amLODipine (NORVASC) 10 MG tablet TAKE ONE TABLET BY MOUTH AT BEDTIME 07/03/22   Rollene Rotunda, MD  colchicine 0.6 MG  tablet Take 0.6 mg by mouth 2 (two) times daily as needed. 08/28/22   [provider]  COMBIGAN 0.2-0.5 % ophthalmic solution Place 1 drop into the left eye at bedtime. 06/20/22   [provider]  dofetilide (TIKOSYN) 500 MCG capsule TAKE ONE CAPSULE BY MOUTH TWICE DAILY 09/26/22   Rollene Rotunda, MD  FARXIGA 5 MG TABS tablet Take 5 mg by mouth daily. 08/19/22   [provider]  Glucosamine-Chondroitin (COSAMIN DS PO) Take 1 tablet by mouth at bedtime.    [provider]  Magnesium Oxide 400 MG CAPS Take 1 capsule (400 mg total) by mouth daily. 08/16/21   Rollene Rotunda, MD  metFORMIN (GLUCOPHAGE) 500 MG tablet Take by mouth 2 (two) times daily with a meal.    [provider]  olmesartan (BENICAR) 40 MG tablet Take 1 tablet (40 mg total) by mouth daily. 07/03/22   Rollene Rotunda, MD  potassium chloride (KLOR-CON) 10 MEQ tablet Take 1 tablet (10 mEq total) by mouth daily. 07/03/22   Rollene Rotunda, MD  Travoprost, BAK Free, (TRAVATAN) 0.004 % SOLN ophthalmic solution Place 1 drop into both eyes at bedtime.  04/07/19   [provider]  TRUE METRIX BLOOD GLUCOSE TEST test strip  01/01/20   [provider]  XARELTO 20 MG TABS tablet Take 1 tablet (20 mg total) by mouth every evening. 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   Review of Systems Review of Systems  Respiratory:  Positive for cough.   Per HPI  Physical Exam Triage Vital Signs ED Triage Vitals  [05/27/23 1458]  Encounter Vitals Group     BP (!) 143/82     Systolic BP Percentile      Diastolic BP Percentile      Pulse Rate (!) 58     Resp 20     Temp 98.3 F (36.8 C)     Temp Source Oral     SpO2 96 %     Weight      Height      Head Circumference      Peak Flow      Pain Score 0     Pain Loc      Pain Education      Exclude from Growth Chart    No data found.  Updated Vital Signs BP (!) 143/82 (BP Location: Right Arm)   Pulse (!) 58   Temp 98.3 F (36.8 C) (Oral)   Resp 20   SpO2 96%   Visual Acuity Right Eye Distance:   Left Eye Distance:   Bilateral Distance:    Right Eye Near:   Left Eye Near:    Bilateral Near:     Physical Exam Vitals and nursing note reviewed.  Constitutional:      Appearance: He is not ill-appearing or toxic-appearing.  HENT:     Head: Normocephalic and atraumatic.     Right Ear: Hearing, tympanic membrane, ear canal and external ear normal.     Left Ear: Hearing, tympanic membrane, ear canal and external ear normal.     Nose: Nose normal.     Mouth/Throat:     Lips: Pink.     Mouth: Mucous membranes are moist. No injury.     Tongue: No lesions. Tongue does not deviate from midline.     Palate: No mass and lesions.     Pharynx: Oropharynx is clear. Uvula midline. No pharyngeal swelling, oropharyngeal exudate, posterior oropharyngeal erythema or uvula swelling.     Tonsils: No tonsillar exudate or tonsillar abscesses.  Eyes:     General: Lids are normal. Vision grossly intact. Gaze aligned appropriately.     Extraocular Movements: Extraocular movements intact.     Conjunctiva/sclera: Conjunctivae normal.  Cardiovascular:     Rate and Rhythm: Normal rate and regular rhythm.     Heart sounds: Normal heart sounds, S1 normal and S2 normal.  Pulmonary:     Effort: Pulmonary effort is normal. No respiratory distress.     Breath sounds: Normal breath sounds and air entry.  Musculoskeletal:     Cervical back: Neck supple.   Skin:    General: Skin is warm and dry.     Capillary Refill: Capillary refill takes less than 2 seconds.     Findings: No rash.  Neurological:     General: No focal deficit present.     Mental Status:  He is alert and oriented to person, place, and time. Mental status is at baseline.     Cranial Nerves: No dysarthria or facial asymmetry.  Psychiatric:        Mood and Affect: Mood normal.        Speech: Speech normal.        Behavior: Behavior normal.        Thought Content: Thought content normal.        Judgment: Judgment normal.      UC Treatments / Results  Labs (all labs ordered are listed, but only abnormal results are displayed) Labs Reviewed  SARS CORONAVIRUS 2 (TAT 6-24 HRS)    EKG   Radiology No results found.  Procedures Procedures (including critical care time)  Medications Ordered in UC Medications - No data to display  Initial Impression / Assessment and Plan / UC Course  I have reviewed the triage vital signs and the nursing notes.  Pertinent labs & imaging results that were available during my care of the patient were reviewed by me and considered in my medical decision making (see chart for details).   1. Viral URI with cough Suspect viral URI, viral syndrome. Physical exam findings reassuring, vital signs hemodynamically stable. Low suspicion for pneumonia/acute cardiopulmonary abnormality, therefore deferred imaging of the chest. Advised supportive care, offered prescriptions for symptomatic relief.  Recommend continued use of OTC medications as needed, recommendations discussed with patient/caregiver and outlined in AVS.  Strep/viral testing: COVID-19 testing pending.  Counseled patient on potential for adverse effects with medications prescribed/recommended today, strict ER and return-to-clinic precautions discussed, patient verbalized understanding.    Final Clinical Impressions(s) / UC Diagnoses   Final diagnoses:  Viral URI with cough      Discharge Instructions      You have a viral illness which will improve on its own with rest, fluids, and medications to help with your symptoms. We discussed prescriptions that may help with your symptoms: tessalon perles as needed for cough You may use over the counter medicines as needed: tylenol, Coricidin  Two teaspoons of honey in 1 cup of warm water every 4-6 hours may help with throat pains. Humidifier in room at nighttime may help soothe cough (clean well daily).   For chest pain, shortness of breath, inability to keep food or fluids down without vomiting, fever that does not respond to tylenol or motrin, or any other severe symptoms, please go to the ER for further evaluation. Return to urgent care as needed, otherwise follow-up with PCP.     ED Prescriptions     Medication Sig Dispense Auth. Provider   benzonatate (TESSALON) 100 MG capsule Take 1 capsule (100 mg total) by mouth every 8 (eight) hours. 21 capsule Carlisle Beers, FNP      PDMP not reviewed this encounter.   Carlisle Beers, [place] 05/27/23 1552

## 2023-05-27 NOTE — Discharge Instructions (Signed)
You have a viral illness which will improve on its own with rest, fluids, and medications to help with your symptoms. We discussed prescriptions that may help with your symptoms: tessalon perles as needed for cough You may use over the counter medicines as needed: tylenol, Coricidin  Two teaspoons of honey in 1 cup of warm water every 4-6 hours may help with throat pains. Humidifier in room at nighttime may help soothe cough (clean well daily).   For chest pain, shortness of breath, inability to keep food or fluids down without vomiting, fever that does not respond to tylenol or motrin, or any other severe symptoms, please go to the ER for further evaluation. Return to urgent care as needed, otherwise follow-up with PCP.

## 2023-05-29 ENCOUNTER — Other Ambulatory Visit: Payer: Self-pay | Admitting: Cardiology

## 2023-05-29 DIAGNOSIS — I4819 Other persistent atrial fibrillation: Secondary | ICD-10-CM

## 2023-05-29 LAB — SARS CORONAVIRUS 2 (TAT 6-24 HRS): SARS Coronavirus 2: NEGATIVE

## 2023-05-29 NOTE — Telephone Encounter (Signed)
Xarelto 20mg  refill request received. Pt is 72 years old, weight-103kg, Crea-1.02 on 04/24/23 via [employer] [facility] from [facility], last seen by Dr. Antoine Poche on 09/01/22, Diagnosis-Afib, CrCl-93.97 mL/min; Dose is appropriate based on dosing criteria. Will send in refill to requested pharmacy.

## 2023-06-14 DIAGNOSIS — Z0000 Encounter for general adult medical examination without abnormal findings: Secondary | ICD-10-CM | POA: Diagnosis not present

## 2023-06-14 DIAGNOSIS — Z9181 History of falling: Secondary | ICD-10-CM | POA: Diagnosis not present

## 2023-06-25 ENCOUNTER — Other Ambulatory Visit: Payer: Self-pay | Admitting: Cardiology

## 2023-07-05 ENCOUNTER — Other Ambulatory Visit: Payer: Self-pay

## 2023-07-05 DIAGNOSIS — I4819 Other persistent atrial fibrillation: Secondary | ICD-10-CM

## 2023-07-05 MED ORDER — XARELTO 20 MG PO TABS: 20.0000 mg | ORAL_TABLET | Freq: Every evening | ORAL | 1 refills | Status: DC

## 2023-07-05 NOTE — Addendum Note (Signed)
Addended by: Betsy Coder B on: 07/05/2023 02:42 PM   Modules accepted: Orders

## 2023-07-05 NOTE — Telephone Encounter (Signed)
Prescription refill request for Xarelto received.  Indication: Afib  Last office visit: 09/01/22 ([facility])  Weight: 103kg Age: 73 Scr: 1.08 (10/19/22)  CrCl: 88.35ml/min  Appropriate dose. Refill sent.

## 2023-07-12 ENCOUNTER — Other Ambulatory Visit: Payer: Self-pay | Admitting: Cardiology

## 2023-07-20 ENCOUNTER — Other Ambulatory Visit: Payer: Self-pay | Admitting: Cardiology

## 2023-07-30 DIAGNOSIS — E782 Mixed hyperlipidemia: Secondary | ICD-10-CM | POA: Diagnosis not present

## 2023-07-30 DIAGNOSIS — Z139 Encounter for screening, unspecified: Secondary | ICD-10-CM | POA: Diagnosis not present

## 2023-07-30 DIAGNOSIS — I10 Essential (primary) hypertension: Secondary | ICD-10-CM | POA: Diagnosis not present

## 2023-07-30 DIAGNOSIS — Z6832 Body mass index (BMI) 32.0-32.9, adult: Secondary | ICD-10-CM | POA: Diagnosis not present

## 2023-07-30 DIAGNOSIS — I4819 Other persistent atrial fibrillation: Secondary | ICD-10-CM | POA: Diagnosis not present

## 2023-07-30 DIAGNOSIS — E1139 Type 2 diabetes mellitus with other diabetic ophthalmic complication: Secondary | ICD-10-CM | POA: Diagnosis not present

## 2023-07-30 DIAGNOSIS — R809 Proteinuria, unspecified: Secondary | ICD-10-CM | POA: Diagnosis not present

## 2023-07-30 DIAGNOSIS — E1129 Type 2 diabetes mellitus with other diabetic kidney complication: Secondary | ICD-10-CM | POA: Diagnosis not present

## 2023-07-30 DIAGNOSIS — Z125 Encounter for screening for malignant neoplasm of prostate: Secondary | ICD-10-CM | POA: Diagnosis not present

## 2023-09-05 NOTE — Progress Notes (Unsigned)
  Cardiology Office Note:   Date:  09/06/2023  ID:  MD SMOLA, DOB [birth year], MRN [id number] PCP: Guadalupe Maple., MD  [facility] Providers Cardiologist:  Rollene Rotunda, MD {  History of Present Illness:   Frank Chase is a 73 y.o. male who is referred by Guadalupe Maple., MD for evaluation of atrial fib.  He is status post DCCV.   He had recurrent atrial fib.   He had Tikosyn and DCCV.    Since I last saw him he has done OK.  The patient denies any new symptoms such as chest discomfort, neck or arm discomfort. There has been no new shortness of breath, PND or orthopnea. There have been no reported palpitations, presyncope or syncope.  He has been cutting wood with a chainsaw.  [place] has been heavy work.  He denies any cardiovascular symptoms.   He has not felt he was back in atrial fibrillation.  ROS: As stated in the HPI and negative for all other systems.  Studies Reviewed:    EKG:   EKG Interpretation Date/Time:  Thursday September 06 2023 15:46:27 EDT Ventricular Rate:  60 PR Interval:  158 QRS Duration:  94 QT Interval:  418 QTC Calculation: 418 R Axis:   10  Text Interpretation: Sinus rhythm with Premature atrial complexes When compared with ECG of 16-Jan-2020 11:21, No significant change since last tracing Confirmed by Rollene Rotunda ([id number]) on 09/06/2023 3:58:00 PM    Risk Assessment/Calculations:    CHA2DS2-VASc Score = 2   This indicates a 2.2% annual risk of stroke. The patient's score is based upon: CHF History: 0 HTN History: 1 Diabetes History: 0 Stroke History: 0 Vascular Disease History: 0 Age Score: 1 Gender Score: 0   Physical Exam:   VS:  BP 122/60 (BP Location: Left Arm, Patient Position: Sitting, Cuff Size: Large)   Pulse 61   Ht 5\' 10"  (1.778 m)   Wt 227 lb (103 kg)   SpO2 96%   BMI 32.57 kg/m    Wt Readings from Last 3 Encounters:  09/06/23 227 lb (103 kg)  09/01/22  227 lb (103 kg)  08/12/21 234 lb (106.1 kg)     GEN: Well nourished, well developed in no acute distress NECK: No JVD; No carotid bruits CARDIAC: RRR, no murmurs, rubs, gallops RESPIRATORY:  Clear to auscultation without rales, wheezing or rhonchi  ABDOMEN: Soft, non-tender, non-distended EXTREMITIES:  No edema; No deformity   ASSESSMENT AND PLAN:   Persistent Atrial Fibrillation:      The patient denies any palpitations.  He tolerates anticoagulation.  He is up-to-date with his potassium checked but I will check a magnesium level.   HTN: Blood pressure is at target.  No change in therapy.    Follow up with me in one year.   Signed, Rollene Rotunda, MD

## 2023-09-06 ENCOUNTER — Ambulatory Visit: Payer: Medicare HMO | Attending: Cardiology | Admitting: Cardiology

## 2023-09-06 ENCOUNTER — Encounter: Payer: Self-pay | Admitting: Cardiology

## 2023-09-06 VITALS — BP 122/60 | HR 61 | Ht 70.0 in | Wt 227.0 lb

## 2023-09-06 DIAGNOSIS — I10 Essential (primary) hypertension: Secondary | ICD-10-CM

## 2023-09-06 DIAGNOSIS — I4819 Other persistent atrial fibrillation: Secondary | ICD-10-CM | POA: Diagnosis not present

## 2023-09-06 NOTE — Patient Instructions (Signed)
 Medication Instructions:  No changes.  *If you need a refill on your cardiac medications before your next appointment, please call your pharmacy*   Lab Work: Magnesium today.  If you have labs (blood work) drawn today and your tests are completely normal, you will receive your results only by: MyChart Message (if you have MyChart) OR A paper copy in the mail If you have any lab test that is abnormal or we need to change your treatment, we will call you to review the results.  Follow-Up: At [facility], you and your health needs are our priority.  As part of our continuing mission to provide you with exceptional heart care, we have created designated Provider Care Teams.  These Care Teams include your primary Cardiologist (physician) and Advanced Practice Providers (APPs -  Physician Assistants and Nurse Practitioners) who all work together to provide you with the care you need, when you need it.  We recommend signing up for the patient portal called "MyChart".  Sign up information is provided on this After Visit Summary.  MyChart is used to connect with patients for Virtual Visits (Telemedicine).  Patients are able to view lab/test results, encounter notes, upcoming appointments, etc.  Non-urgent messages can be sent to your provider as well.   To learn more about what you can do with MyChart, go to [email or web address].    Your next appointment:   1 year(s)  Provider:   Rollene Rotunda, MD     Other Instructions

## 2023-09-07 LAB — MAGNESIUM: Magnesium: 2.2 mg/dL (ref 1.6–2.3)

## 2023-09-24 DIAGNOSIS — H40011 Open angle with borderline findings, low risk, right eye: Secondary | ICD-10-CM | POA: Diagnosis not present

## 2023-09-24 DIAGNOSIS — H401123 Primary open-angle glaucoma, left eye, severe stage: Secondary | ICD-10-CM | POA: Diagnosis not present

## 2023-09-27 ENCOUNTER — Other Ambulatory Visit: Payer: Self-pay | Admitting: Cardiology

## 2023-10-31 DIAGNOSIS — I10 Essential (primary) hypertension: Secondary | ICD-10-CM | POA: Diagnosis not present

## 2023-10-31 DIAGNOSIS — I4819 Other persistent atrial fibrillation: Secondary | ICD-10-CM | POA: Diagnosis not present

## 2023-10-31 DIAGNOSIS — E782 Mixed hyperlipidemia: Secondary | ICD-10-CM | POA: Diagnosis not present

## 2023-10-31 DIAGNOSIS — D582 Other hemoglobinopathies: Secondary | ICD-10-CM | POA: Diagnosis not present

## 2023-10-31 DIAGNOSIS — E1139 Type 2 diabetes mellitus with other diabetic ophthalmic complication: Secondary | ICD-10-CM | POA: Diagnosis not present

## 2023-10-31 DIAGNOSIS — E1129 Type 2 diabetes mellitus with other diabetic kidney complication: Secondary | ICD-10-CM | POA: Diagnosis not present

## 2023-10-31 DIAGNOSIS — M109 Gout, unspecified: Secondary | ICD-10-CM | POA: Diagnosis not present

## 2023-10-31 DIAGNOSIS — R809 Proteinuria, unspecified: Secondary | ICD-10-CM | POA: Diagnosis not present

## 2023-10-31 DIAGNOSIS — Z6832 Body mass index (BMI) 32.0-32.9, adult: Secondary | ICD-10-CM | POA: Diagnosis not present

## 2023-11-16 DIAGNOSIS — H40051 Ocular hypertension, right eye: Secondary | ICD-10-CM | POA: Diagnosis not present

## 2023-11-16 DIAGNOSIS — H4032X3 Glaucoma secondary to eye trauma, left eye, severe stage: Secondary | ICD-10-CM | POA: Diagnosis not present

## 2023-12-12 ENCOUNTER — Encounter (HOSPITAL_BASED_OUTPATIENT_CLINIC_OR_DEPARTMENT_OTHER): Payer: Self-pay

## 2023-12-12 ENCOUNTER — Emergency Department (HOSPITAL_COMMUNITY)
Admission: EM | Admit: 2023-12-12 | Discharge: 2023-12-12 | Disposition: A | Discharge status: Home / Self Care | Attending: Emergency Medicine | Admitting: Emergency Medicine

## 2023-12-12 ENCOUNTER — Ambulatory Visit (HOSPITAL_BASED_OUTPATIENT_CLINIC_OR_DEPARTMENT_OTHER): Admission: EM | Admit: 2023-12-12 | Discharge: 2023-12-12 | Disposition: A | Discharge status: Home / Self Care

## 2023-12-12 ENCOUNTER — Other Ambulatory Visit: Payer: Self-pay

## 2023-12-12 ENCOUNTER — Emergency Department (HOSPITAL_COMMUNITY)

## 2023-12-12 DIAGNOSIS — Z7901 Long term (current) use of anticoagulants: Secondary | ICD-10-CM | POA: Diagnosis not present

## 2023-12-12 DIAGNOSIS — I4891 Unspecified atrial fibrillation: Secondary | ICD-10-CM | POA: Insufficient documentation

## 2023-12-12 DIAGNOSIS — R0602 Shortness of breath: Secondary | ICD-10-CM | POA: Diagnosis not present

## 2023-12-12 DIAGNOSIS — R7989 Other specified abnormal findings of blood chemistry: Secondary | ICD-10-CM | POA: Diagnosis not present

## 2023-12-12 DIAGNOSIS — R531 Weakness: Secondary | ICD-10-CM | POA: Diagnosis present

## 2023-12-12 LAB — CBC WITH DIFFERENTIAL/PLATELET
Abs Immature Granulocytes: 0.03 10*3/uL (ref 0.00–0.07)
Basophils Absolute: 0.1 10*3/uL (ref 0.0–0.1)
Basophils Relative: 1 %
Eosinophils Absolute: 0.3 10*3/uL (ref 0.0–0.5)
Eosinophils Relative: 4 %
HCT: 53.9 % — ABNORMAL HIGH (ref 39.0–52.0)
Hemoglobin: 18.2 g/dL — ABNORMAL HIGH (ref 13.0–17.0)
Immature Granulocytes: 0 %
Lymphocytes Relative: 27 %
Lymphs Abs: 2 10*3/uL (ref 0.7–4.0)
MCH: 29 pg (ref 26.0–34.0)
MCHC: 33.8 g/dL (ref 30.0–36.0)
MCV: 85.8 fL (ref 80.0–100.0)
Monocytes Absolute: 0.8 10*3/uL (ref 0.1–1.0)
Monocytes Relative: 11 %
Neutro Abs: 4.2 10*3/uL (ref 1.7–7.7)
Neutrophils Relative %: 57 %
Platelets: 320 10*3/uL (ref 150–400)
RBC: 6.28 MIL/uL — ABNORMAL HIGH (ref 4.22–5.81)
RDW: 13.4 % (ref 11.5–15.5)
WBC: 7.4 10*3/uL (ref 4.0–10.5)
nRBC: 0 % (ref 0.0–0.2)

## 2023-12-12 LAB — COMPREHENSIVE METABOLIC PANEL WITH GFR
ALT: 25 U/L (ref 0–44)
AST: 24 U/L (ref 15–41)
Albumin: 4.1 g/dL (ref 3.5–5.0)
Alkaline Phosphatase: 48 U/L (ref 38–126)
Anion gap: 11 (ref 5–15)
BUN: 20 mg/dL (ref 8–23)
CO2: 22 mmol/L (ref 22–32)
Calcium: 9.4 mg/dL (ref 8.9–10.3)
Chloride: 106 mmol/L (ref 98–111)
Creatinine, Ser: 1.4 mg/dL — ABNORMAL HIGH (ref 0.61–1.24)
GFR, Estimated: 53 mL/min — ABNORMAL LOW (ref >60)
Glucose, Bld: 146 mg/dL — ABNORMAL HIGH (ref 70–99)
Potassium: 4.1 mmol/L (ref 3.5–5.1)
Sodium: 139 mmol/L (ref 135–145)
Total Bilirubin: 0.8 mg/dL (ref 0.0–1.2)
Total Protein: 5.6 g/dL — ABNORMAL LOW (ref 6.5–8.1)

## 2023-12-12 LAB — PROTIME-INR
INR: 1.1 (ref 0.8–1.2)
Prothrombin Time: 14.8 s (ref 11.4–15.2)

## 2023-12-12 LAB — MAGNESIUM: Magnesium: 2.6 mg/dL — ABNORMAL HIGH (ref 1.7–2.4)

## 2023-12-12 MED ORDER — DILTIAZEM HCL ER COATED BEADS 180 MG PO CP24: 180.0000 mg | ORAL_CAPSULE | Freq: Every day | ORAL | 0 refills | Status: DC

## 2023-12-12 MED ORDER — SODIUM CHLORIDE 0.9 % IV BOLUS
500.0000 mL | Freq: Once | INTRAVENOUS | Status: AC
  Administered 2023-12-12: 500 mL via INTRAVENOUS

## 2023-12-12 MED ORDER — DILTIAZEM HCL 25 MG/5ML IV SOLN
10.0000 mg | Freq: Once | INTRAVENOUS | Status: AC
  Administered 2023-12-12: 10 mg via INTRAVENOUS
  Filled 2023-12-12: qty 5

## 2023-12-12 NOTE — ED Triage Notes (Signed)
 Patient states feels like he is in afib. Wants EKG before driving to [place]. Feeling weak. Heart rate irregular by palpation.

## 2023-12-12 NOTE — Discharge Instructions (Signed)
 Please go to the ER for Afib and weakness

## 2023-12-12 NOTE — Discharge Instructions (Addendum)
 Start taking the Cardizem medication to help with your heart rate.  You can hold your amlodipine  medication while taking the Cardizem until directed otherwise by your cardiologist.. They are both types of calcium channel blockers.  Follow-up with your cardiologist as we discussed.

## 2023-12-12 NOTE — ED Provider Triage Note (Signed)
 Emergency Medicine Provider Triage Evaluation Note  GERON MULFORD , a 73 y.o. male  was evaluated in triage.  Pt complains of shortness of breath since this morning.  Underlying history of A-fib, went to urgent care and was in the ER.  Currently anticoagulated on Xarelto .  Reports he has been cardioverted once.  He usually just follows up with his cardiologist for this, however felt really weak this morning.  Review of Systems  Positive: Palpitations, shortness of breath Negative: Cough, fever, leg swelling  Physical Exam  There were no vitals taken for this visit. Gen:   Awake, no distress   Resp:  Normal effort  MSK:   Moves extremities without difficulty  Other:  No bilateral pitting edema.  Medical Decision Making  Medically screening exam initiated at 4:16 PM.  Appropriate orders placed.  JAKORI BURKETT was informed that the remainder of the evaluation will be completed by another provider, this initial triage assessment does not replace that evaluation, and the importance of remaining in the ED until their evaluation is complete.     Stepheny Canal, PA-C 12/12/23 1620

## 2023-12-12 NOTE — ED Provider Notes (Signed)
 [facility]    CSN: [id number] Arrival date & time: 12/12/23  1446      History   Chief Complaint Chief Complaint  Patient presents with   Weakness   Atrial Fibrillation    HPI Frank Chase is a 73 y.o. male.   Patient is a 73 year old male with past medical history of atrial fibrillation, diabetes, hypertension.  He presents today with overall generalized weakness over the past day or so.  He believes he may be in A-fib again.  He is not having any chest pain, shortness of breath, dizziness or lightheadedness.  He has had cardioversion in the past to treat this   Weakness Atrial Fibrillation    Past Medical History:  Diagnosis Date   Atrial fibrillation (HCC)    Diabetes mellitus without complication (HCC)    Hypertension     Patient Active Problem List   Diagnosis Date Noted   Morbid obesity (HCC) 08/10/2021   Obesity (BMI 30.0-34.9) 02/05/2020   Secondary hypercoagulable state (HCC) 01/02/2020   PAF (paroxysmal atrial fibrillation) (HCC) 12/15/2019   Persistent atrial fibrillation (HCC) 05/28/2019   Essential hypertension 05/28/2019   Educated about COVID-19 virus infection 05/28/2019    Past Surgical History:  Procedure Laterality Date   CARDIOVERSION N/A 05/12/2019   Procedure: CARDIOVERSION;  Surgeon: Alveta Aleene PARAS, MD;  Location: [facility] ENDOSCOPY;  Service: Cardiovascular;  Laterality: N/A;   CARDIOVERSION N/A 01/08/2020   Procedure: CARDIOVERSION;  Surgeon: Barbaraann Darryle Ned, MD;  Location: [facility] ENDOSCOPY;  Service: Cardiovascular;  Laterality: N/A;   WRIST SURGERY         Home Medications    Prior to Admission medications   Medication Sig Start Date End Date Taking? Authorizing Provider  JARDIANCE 10 MG TABS tablet Take 10 mg by mouth daily. 09/24/23  Yes [provider]  amLODipine  (NORVASC ) 10 MG tablet TAKE ONE TABLET BY MOUTH AT BEDTIME 06/26/23   Lavona Agent, MD  benzonatate  (TESSALON ) 100 MG capsule Take 1 capsule  (100 mg total) by mouth every 8 (eight) hours. Patient not taking: Reported on 09/06/2023 05/27/23   Enedelia Dorna HERO, FNP  colchicine 0.6 MG tablet Take 0.6 mg by mouth 2 (two) times daily as needed. Patient not taking: Reported on 09/06/2023 08/28/22   [provider]  COMBIGAN 0.2-0.5 % ophthalmic solution Place 1 drop into the left eye at bedtime. 06/20/22   [provider]  dofetilide  (TIKOSYN ) 500 MCG capsule TAKE ONE CAPSULE BY MOUTH TWICE DAILY 09/27/23   Lavona Agent, MD  FARXIGA 5 MG TABS tablet Take 5 mg by mouth daily. 08/19/22   [provider]  Glucosamine-Chondroitin (COSAMIN DS PO) Take 1 tablet by mouth at bedtime.    [provider]  Magnesium  Oxide 400 MG CAPS Take 1 capsule (400 mg total) by mouth daily. 08/16/21   Lavona Agent, MD  metFORMIN  (GLUCOPHAGE ) 500 MG tablet Take by mouth 2 (two) times daily with a meal.    [provider]  olmesartan  (BENICAR ) 40 MG tablet TAKE ONE TABLET BY MOUTH ONCE DAILY 07/12/23   Lavona Agent, MD  potassium chloride  (KLOR-CON ) 10 MEQ tablet TAKE ONE TABLET BY MOUTH DAILY 07/20/23   Lavona Agent, MD  Travoprost, BAK Free, (TRAVATAN) 0.004 % SOLN ophthalmic solution Place 1 drop into both eyes at bedtime.  04/07/19   [provider]  TRUE METRIX BLOOD GLUCOSE TEST test strip  01/01/20   [provider]  XARELTO  20 MG TABS tablet Take 1 tablet (  20 mg total) by mouth every evening. 07/05/23   Lavona Agent, MD    Family History Family History  Problem Relation Age of Onset   Alzheimer's disease Mother     Social History Social History   Tobacco Use   Smoking status: Former    Current packs/day: 0.00    Types: Cigarettes    Quit date: 1978    Years since quitting: 47.5   Smokeless tobacco: Former    Quit date: 1985  Vaping Use   Vaping status: Never Used  Substance Use Topics   Alcohol use: Yes    Comment: social   Drug use: Never     Allergies   Patient  has no known allergies.   Review of Systems Review of Systems  Neurological:  Positive for weakness.  See HPI   Physical Exam Triage Vital Signs ED Triage Vitals  Encounter Vitals Group     BP 12/12/23 1500 123/76     Girls Systolic BP Percentile --      Girls Diastolic BP Percentile --      Boys Systolic BP Percentile --      Boys Diastolic BP Percentile --      Pulse Rate 12/12/23 1500 83     Resp 12/12/23 1500 20     Temp 12/12/23 1500 98.3 F (36.8 C)     Temp Source 12/12/23 1500 Oral     SpO2 12/12/23 1500 98 %     Weight --      Height --      Head Circumference --      Peak Flow --      Pain Score 12/12/23 1509 0     Pain Loc --      Pain Education --      Exclude from Growth Chart --    No data found.  Updated Vital Signs BP 123/76 (BP Location: Right Arm)   Pulse 83   Temp 98.3 F (36.8 C) (Oral)   Resp 20   SpO2 98%   Visual Acuity Right Eye Distance:   Left Eye Distance:   Bilateral Distance:    Right Eye Near:   Left Eye Near:    Bilateral Near:     Physical Exam Constitutional:      General: He is not in acute distress.    Appearance: Normal appearance. He is not ill-appearing, toxic-appearing or diaphoretic.   Cardiovascular:     Rate and Rhythm: Rhythm irregular.  Pulmonary:     Effort: Pulmonary effort is normal.     Breath sounds: Normal breath sounds.   Skin:    General: Skin is warm and dry.   Neurological:     General: No focal deficit present.     Mental Status: He is alert.      UC Treatments / Results  Labs (all labs ordered are listed, but only abnormal results are displayed) Labs Reviewed - No data to display  EKG   Radiology No results found.  Procedures Procedures (including critical care time)  Medications Ordered in UC Medications - No data to display  Initial Impression / Assessment and Plan / UC Course  I have reviewed the triage vital signs and the nursing notes.  Pertinent labs & imaging  results that were available during my care of the patient were reviewed by me and considered in my medical decision making (see chart for details).     Atrial fibrillation-EKG done here today that shows A-fib.  Patient  is overall just feeling very weak.  He is not having any chest pain or shortness of breath.  Does not appear to be A-fib with RVR.  Patient's wife brought him here wanting to do an EKG to confirm A-fib prior to taking him to come to hospital. Patient wife will drive him to the ER for further evaluation of his A-fib. He is otherwise stable Final Clinical Impressions(s) / UC Diagnoses   Final diagnoses:  Atrial fibrillation, unspecified type [facility])     Discharge Instructions      Please go to the ER for Afib and weakness    ED Prescriptions   None    PDMP not reviewed this encounter.   Adah Wilbert LABOR, FNP 12/12/23 1624

## 2023-12-12 NOTE — ED Notes (Signed)
 Patient is being discharged from the Urgent Care and sent to the Emergency Department via pov . Per T. Bast, FNP, patient is in need of higher level of care due to symptomatic afib. Patient is aware and verbalizes understanding of plan of care.  Vitals:   12/12/23 1500  BP: 123/76  Pulse: 83  Resp: 20  Temp: 98.3 F (36.8 C)  SpO2: 98%

## 2023-12-12 NOTE — ED Provider Notes (Signed)
 Frank Chase   CSN: [id number] Arrival date & time: 12/12/23  1559     Patient presents with: Weakness (Pt went to UC sent to us  for A-fib, hx of A-fib)   Frank Chase is a 73 y.o. male.    Weakness    Pt woke up this am feeling weak.  He has history of a fib and felt like he was in it.  No CP.  No sob.  No fevers or chills.  He went to an UC and they confirmed a fib. Pt states the last time he was in it was at least a year ago.  Pt was instructed to monitor when it happened the first time and it resolved on its own.  Prior to Admission medications   Medication Sig Start Date End Date Taking? Authorizing Provider  diltiazem (CARDIZEM CD) 180 MG 24 hr capsule Take 1 capsule (180 mg total) by mouth daily. 12/12/23  Yes Randol Simmonds, MD  amLODipine  (NORVASC ) 10 MG tablet TAKE ONE TABLET BY MOUTH AT BEDTIME 06/26/23   Lavona Agent, MD  benzonatate  (TESSALON ) 100 MG capsule Take 1 capsule (100 mg total) by mouth every 8 (eight) hours. Patient not taking: Reported on 09/06/2023 05/27/23   Enedelia Dorna HERO, FNP  COMBIGAN 0.2-0.5 % ophthalmic solution Place 1 drop into the left eye at bedtime. 06/20/22   [provider]  dofetilide  (TIKOSYN ) 500 MCG capsule TAKE ONE CAPSULE BY MOUTH TWICE DAILY 09/27/23   Lavona Agent, MD  FARXIGA 5 MG TABS tablet Take 5 mg by mouth daily. 08/19/22   [provider]  Glucosamine-Chondroitin (COSAMIN DS PO) Take 1 tablet by mouth at bedtime.    [provider]  JARDIANCE 10 MG TABS tablet Take 10 mg by mouth daily. 09/24/23   [provider]  Magnesium  Oxide 400 MG CAPS Take 1 capsule (400 mg total) by mouth daily. 08/16/21   Lavona Agent, MD  metFORMIN  (GLUCOPHAGE ) 500 MG tablet Take by mouth 2 (two) times daily with a meal.    [provider]  olmesartan  (BENICAR ) 40 MG tablet TAKE ONE TABLET BY MOUTH ONCE DAILY 07/12/23   Lavona Agent, MD   potassium chloride  (KLOR-CON ) 10 MEQ tablet TAKE ONE TABLET BY MOUTH DAILY 07/20/23   Lavona Agent, MD  Travoprost, BAK Free, (TRAVATAN) 0.004 % SOLN ophthalmic solution Place 1 drop into both eyes at bedtime.  04/07/19   [provider]  TRUE METRIX BLOOD GLUCOSE TEST test strip  01/01/20   [provider]  XARELTO  20 MG TABS tablet Take 1 tablet (20 mg total) by mouth every evening. 07/05/23   Lavona Agent, MD    Allergies: Patient has no known allergies.    Review of Systems  Neurological:  Positive for weakness.    Updated Vital Signs BP (!) 159/90   Pulse 87   Temp 98.1 F (36.7 C) (Oral)   Resp 17   Ht 1.778 m (5' 10)   Wt 103.4 kg   SpO2 97%   BMI 32.71 kg/m   Physical Exam  (all labs ordered are listed, but only abnormal results are displayed) Labs Reviewed  CBC WITH DIFFERENTIAL/PLATELET - Abnormal; Notable for the following components:      Result Value   RBC 6.28 (*)    Hemoglobin 18.2 (*)    HCT 53.9 (*)    All other components within normal limits  COMPREHENSIVE METABOLIC PANEL WITH GFR - Abnormal; Notable  for the following components:   Glucose, Bld 146 (*)    Creatinine, Ser 1.40 (*)    Total Protein 5.6 (*)    GFR, Estimated 53 (*)    All other components within normal limits  MAGNESIUM  - Abnormal; Notable for the following components:   Magnesium  2.6 (*)    All other components within normal limits  PROTIME-INR    EKG: EKG Interpretation Date/Time:  Wednesday December 12 2023 18:45:27 EDT Ventricular Rate:  106 PR Interval:    QRS Duration:  76 QT Interval:  382 QTC Calculation: 508 R Axis:   37  Text Interpretation: Atrial fibrillation Borderline repolarization abnormality Prolonged QT interval No significant change since last tracing Confirmed by Randol Simmonds [id number]) on 12/12/2023 6:46:43 PM  Radiology: [facility] Chest 2 View Result Date: 12/12/2023 CLINICAL DATA:  Shortness of breath EXAM: CHEST - 2 VIEW COMPARISON:  X-ray  08/29/2003. FINDINGS: Old right clavicle fracture. No consolidation, pneumothorax or effusion. No edema. Normal cardiopericardial silhouette. Mild degenerative changes of the spine. There is minimal compression deformity at the thoracolumbar junction, similar to previous. IMPRESSION: No acute cardiopulmonary disease. Electronically Signed   By: Ranell Bring M.D.   On: 12/12/2023 17:39     Procedures   Medications Ordered in the ED  sodium chloride  0.9 % bolus 500 mL (500 mLs Intravenous New Bag/Given 12/12/23 1919)  diltiazem (CARDIZEM) injection 10 mg (10 mg Intravenous Given 12/12/23 1919)    Clinical Course as of 12/12/23 2039  Wed Dec 12, 2023  1802 CBC with Differential(!) Hgb increased  [JK]  1802 Comprehensive metabolic panel(!) Cr increased [JK]  1802 Magnesium (!) increased [JK]  1855 Discussed findings with patient.  Offered ED cardioversion.  Patient declined. [JK]    Clinical Course User Index [JK] Randol Simmonds, MD                                 Medical Decision Making Problems Addressed: Atrial fibrillation, unspecified type [facility]): chronic illness or injury with exacerbation, progression, or side effects of treatment  Amount and/or Complexity of Data Reviewed Labs: ordered. Decision-making details documented in ED Course.  Risk Prescription drug management. Drug therapy requiring intensive monitoring for toxicity.   Patient presented to the ED for evaluation of atrial fibrillation.  Patient has known history of A-fib.  He is already on anticoagulation.  Patient's ED workup is reassuring.  No signs of acute infection.  No signs of severe dehydration.  No significant electrolyte abnormalities.  Discussed option of proceeding with cardioversion while the patient is in the ED.  He was not interested in having that performed at this time.  Patient has spontaneously converted in the past.  He was given 1 dose of Cardizem and his heart rate was well-controlled.  Will have him  start taking Cardizem CD and hold his Norvasc  temporarily.  Patient plans to call his cardiologist tomorrow for close outpatient follow-up.     Final diagnoses:  Atrial fibrillation, unspecified type [facility])    ED Discharge Orders          Ordered    diltiazem (CARDIZEM CD) 180 MG 24 hr capsule  Daily        12/12/23 2038               Randol Simmonds, MD 12/12/23 2039

## 2023-12-12 NOTE — ED Triage Notes (Signed)
 Pt. Woke up with weakness today, Hx of A-fib and went to UC to be evaluated and found to be in A-fib.  Pt. Is on Xerelto. Pt. Denies any chest pain , sob , n/v  Pt. Denies any cold symptoms.

## 2023-12-13 ENCOUNTER — Telehealth: Payer: Self-pay | Admitting: Cardiology

## 2023-12-13 ENCOUNTER — Encounter: Payer: Self-pay | Admitting: Cardiology

## 2023-12-13 NOTE — Telephone Encounter (Signed)
 Spoke with pt, Follow up scheduled

## 2023-12-13 NOTE — Telephone Encounter (Signed)
 Returned patient's phone call. He has also send a MyChart msg with [facility] EKG on it. He states he went to the ER last night feeling weak (was at UC, EKG confirmed afib). They discharged him on Diltiazem 180 mg daily to slow the heart rate. Pt states he is not sure that the medication is helping too much currently. VS today: BP 118/98 HR:130 . He states he feels fine today, just a bit weak.

## 2023-12-13 NOTE — Telephone Encounter (Signed)
 Pt would like a c/b in regards to the mychart message that was recently sent. Please advise

## 2023-12-14 ENCOUNTER — Ambulatory Visit (HOSPITAL_COMMUNITY)
Admission: RE | Admit: 2023-12-14 | Discharge: 2023-12-14 | Disposition: A | Discharge status: Home / Self Care | Source: Ambulatory Visit | Attending: Physician Assistant | Admitting: Physician Assistant

## 2023-12-14 VITALS — BP 108/70 | HR 60 | Ht 70.0 in | Wt 219.8 lb

## 2023-12-14 DIAGNOSIS — Z79899 Other long term (current) drug therapy: Secondary | ICD-10-CM

## 2023-12-14 DIAGNOSIS — I4891 Unspecified atrial fibrillation: Secondary | ICD-10-CM

## 2023-12-14 DIAGNOSIS — Z5181 Encounter for therapeutic drug level monitoring: Secondary | ICD-10-CM | POA: Diagnosis not present

## 2023-12-14 DIAGNOSIS — D6869 Other thrombophilia: Secondary | ICD-10-CM

## 2023-12-14 DIAGNOSIS — I4819 Other persistent atrial fibrillation: Secondary | ICD-10-CM | POA: Diagnosis not present

## 2023-12-14 MED ORDER — DILTIAZEM HCL ER COATED BEADS 180 MG PO CP24: 180.0000 mg | ORAL_CAPSULE | Freq: Every day | ORAL | Status: DC

## 2023-12-14 MED ORDER — AMLODIPINE BESYLATE 10 MG PO TABS: 10.0000 mg | ORAL_TABLET | Freq: Every day | ORAL | Status: DC

## 2023-12-14 NOTE — Progress Notes (Signed)
 Primary Care Physician: System, Provider Not In Primary Cardiologist: Dr Lavona  Primary Electrophysiologist: Dr Inocencio Referring Physician: Dr Lavona Rattler Frank Chase is a 73 y.o. male with a history of HTN, DM and persistent atrial fibrillation who presents for follow up in the [facility]. The patient was initially diagnosed with atrial fibrillation 04/2019 incidentally by the patient's wife who noted an irregular heart beat. He presented to his PCP who confirmed afib on ECG. He was started on Xarelto  and underwent DCCV on 05/12/19. Patient did well until follow up 11/2019 when he noted weakness and SOB with exertion and ECG showed he was back in afib. Patient is on Xarelto  for stroke prevention.  Patient is s/p dofetilide  loading 7/20-7/23/21. He underwent DCCV on 01/08/20.   Patient returns for follow up for atrial fibrillation and dofetilide  monitoring. Patient was seen at the ED 12/12/23 with generalized weakness and was found to be back in afib. He was started on diltiazem . His heart rate is better controlled but his symptoms persist. There were no specific triggers for his afib that he could identify.   Today, he  denies symptoms of chest pain, shortness of breath, orthopnea, PND, lower extremity edema, presyncope, syncope, snoring, daytime somnolence, bleeding, or neurologic sequela. The patient is tolerating medications without difficulties and is otherwise without complaint today.    Atrial Fibrillation Risk Factors:  he does not have symptoms or diagnosis of sleep apnea. he does not have a history of rheumatic fever.   Atrial Fibrillation Management history:  Previous antiarrhythmic drugs: dofetilide  Previous cardioversions: 05/12/19, 01/08/20 Previous ablations: none Anticoagulation history: Xarelto    Past Medical History:  Diagnosis Date   Atrial fibrillation (HCC)    Diabetes mellitus without complication (HCC)    Hypertension      Current Outpatient Medications  Medication Sig Dispense Refill   COMBIGAN 0.2-0.5 % ophthalmic solution Place 1 drop into the left eye at bedtime.     diltiazem  (CARDIZEM  CD) 180 MG 24 hr capsule Take 1 capsule (180 mg total) by mouth daily. 14 capsule 0   dofetilide  (TIKOSYN ) 500 MCG capsule TAKE ONE CAPSULE BY MOUTH TWICE DAILY 180 capsule 3   FARXIGA 5 MG TABS tablet Take 5 mg by mouth daily.     Glucosamine-Chondroitin (COSAMIN DS PO) Take 1 tablet by mouth at bedtime.     JARDIANCE 10 MG TABS tablet Take 10 mg by mouth daily.     Magnesium  Oxide 400 MG CAPS Take 1 capsule (400 mg total) by mouth daily. 90 capsule 3   metFORMIN  (GLUCOPHAGE ) 500 MG tablet Take by mouth 2 (two) times daily with a meal.     olmesartan  (BENICAR ) 40 MG tablet TAKE ONE TABLET BY MOUTH ONCE DAILY 90 tablet 3   potassium chloride  (KLOR-CON ) 10 MEQ tablet TAKE ONE TABLET BY MOUTH DAILY 90 tablet 3   Travoprost, BAK Free, (TRAVATAN) 0.004 % SOLN ophthalmic solution Place 1 drop into both eyes at bedtime.      TRUE METRIX BLOOD GLUCOSE TEST test strip      XARELTO  20 MG TABS tablet Take 1 tablet (20 mg total) by mouth every evening. 90 tablet 1   amLODipine  (NORVASC ) 10 MG tablet TAKE ONE TABLET BY MOUTH AT BEDTIME (Patient not taking: Reported on 12/14/2023) 90 tablet 3   No current facility-administered medications for this encounter.    ROS- All systems are reviewed and negative except as per the HPI above.  Physical Exam: Vitals:  12/14/23 0942  BP: 108/70  Pulse: 60  Weight: 99.7 kg  Height: 5' 10 (1.778 m)     GEN: Well nourished, well developed in no acute distress CARDIAC: Irregularly irregular rate and rhythm, no murmurs, rubs, gallops RESPIRATORY:  Clear to auscultation without rales, wheezing or rhonchi  ABDOMEN: Soft, non-tender, non-distended EXTREMITIES:  No edema; No deformity    Wt Readings from Last 3 Encounters:  12/14/23 99.7 kg  12/12/23 103.4 kg  09/06/23 103 kg     EKG today demonstrates  Afib Vent. rate 60 BPM PR interval * ms QRS duration 94 ms QT/QTcB 426/426 ms   Echo 04/17/19 demonstrated  Severely dilated LV, EF 55-60%, normal LA size  Epic records are reviewed at length today   CHA2DS2-VASc Score = 3  The patient's score is based upon: CHF History: 0 HTN History: 1 Diabetes History: 1 Stroke History: 0 Vascular Disease History: 0 Age Score: 1 Gender Score: 0       ASSESSMENT AND PLAN: Persistent Atrial Fibrillation (ICD10:  I48.19) The patient's CHA2DS2-VASc score is 3, indicating a 3.2% annual risk of stroke.   S/p dofetilide  loading 12/2019 Patient back in persistent afib, symptomatic. We discussed rhythm control options. Will plan for DCCV.  Continue dofetilide  500 mcg BID Continue diltiazem  180 mg daily until DCCV then resume amlodipine  10 mg daily. Continue Xarelto  20 mg daily  Secondary Hypercoagulable State (ICD10:  D68.69) The patient is at significant risk for stroke/thromboembolism based upon his CHA2DS2-VASc Score of 3.  Continue Rivaroxaban  (Xarelto ). No bleeding issues.   High Risk Medication Monitoring (ICD 10: Z79.899) QT interval on ECG acceptable for dofetilide  monitoring. Recent labs reviewed.   HTN Stable on current regimen   Follow up in the AF clinic post DCCV.    Informed Consent   Shared Decision Making/Informed Consent The risks (stroke, cardiac arrhythmias rarely resulting in the need for a temporary or permanent pacemaker, skin irritation or burns and complications associated with conscious sedation including aspiration, arrhythmia, respiratory failure and death), benefits (restoration of normal sinus rhythm) and alternatives of a direct current cardioversion were explained in detail to [name] and he agrees to proceed.       Daril Kicks PA-C Afib Clinic [facility] [address] [place], [place] [address] [phone] 12/14/2023 10:04 AM

## 2023-12-14 NOTE — H&P (View-Only) (Signed)
 Primary Care Physician: System, Provider Not In Primary Cardiologist: Dr Lavona  Primary Electrophysiologist: Dr Inocencio Referring Physician: Dr Lavona Rattler Frank Chase is a 73 y.o. male with a history of HTN, DM and persistent atrial fibrillation who presents for follow up in the [facility]. The patient was initially diagnosed with atrial fibrillation 04/2019 incidentally by the patient's wife who noted an irregular heart beat. He presented to his PCP who confirmed afib on ECG. He was started on Xarelto  and underwent DCCV on 05/12/19. Patient did well until follow up 11/2019 when he noted weakness and SOB with exertion and ECG showed he was back in afib. Patient is on Xarelto  for stroke prevention.  Patient is s/p dofetilide  loading 7/20-7/23/21. He underwent DCCV on 01/08/20.   Patient returns for follow up for atrial fibrillation and dofetilide  monitoring. Patient was seen at the ED 12/12/23 with generalized weakness and was found to be back in afib. He was started on diltiazem . His heart rate is better controlled but his symptoms persist. There were no specific triggers for his afib that he could identify.   Today, he  denies symptoms of chest pain, shortness of breath, orthopnea, PND, lower extremity edema, presyncope, syncope, snoring, daytime somnolence, bleeding, or neurologic sequela. The patient is tolerating medications without difficulties and is otherwise without complaint today.    Atrial Fibrillation Risk Factors:  he does not have symptoms or diagnosis of sleep apnea. he does not have a history of rheumatic fever.   Atrial Fibrillation Management history:  Previous antiarrhythmic drugs: dofetilide  Previous cardioversions: 05/12/19, 01/08/20 Previous ablations: none Anticoagulation history: Xarelto    Past Medical History:  Diagnosis Date   Atrial fibrillation (HCC)    Diabetes mellitus without complication (HCC)    Hypertension      Current Outpatient Medications  Medication Sig Dispense Refill   COMBIGAN 0.2-0.5 % ophthalmic solution Place 1 drop into the left eye at bedtime.     diltiazem  (CARDIZEM  CD) 180 MG 24 hr capsule Take 1 capsule (180 mg total) by mouth daily. 14 capsule 0   dofetilide  (TIKOSYN ) 500 MCG capsule TAKE ONE CAPSULE BY MOUTH TWICE DAILY 180 capsule 3   FARXIGA 5 MG TABS tablet Take 5 mg by mouth daily.     Glucosamine-Chondroitin (COSAMIN DS PO) Take 1 tablet by mouth at bedtime.     JARDIANCE 10 MG TABS tablet Take 10 mg by mouth daily.     Magnesium  Oxide 400 MG CAPS Take 1 capsule (400 mg total) by mouth daily. 90 capsule 3   metFORMIN  (GLUCOPHAGE ) 500 MG tablet Take by mouth 2 (two) times daily with a meal.     olmesartan  (BENICAR ) 40 MG tablet TAKE ONE TABLET BY MOUTH ONCE DAILY 90 tablet 3   potassium chloride  (KLOR-CON ) 10 MEQ tablet TAKE ONE TABLET BY MOUTH DAILY 90 tablet 3   Travoprost, BAK Free, (TRAVATAN) 0.004 % SOLN ophthalmic solution Place 1 drop into both eyes at bedtime.      TRUE METRIX BLOOD GLUCOSE TEST test strip      XARELTO  20 MG TABS tablet Take 1 tablet (20 mg total) by mouth every evening. 90 tablet 1   amLODipine  (NORVASC ) 10 MG tablet TAKE ONE TABLET BY MOUTH AT BEDTIME (Patient not taking: Reported on 12/14/2023) 90 tablet 3   No current facility-administered medications for this encounter.    ROS- All systems are reviewed and negative except as per the HPI above.  Physical Exam: Vitals:  12/14/23 0942  BP: 108/70  Pulse: 60  Weight: 99.7 kg  Height: 5' 10 (1.778 m)     GEN: Well nourished, well developed in no acute distress CARDIAC: Irregularly irregular rate and rhythm, no murmurs, rubs, gallops RESPIRATORY:  Clear to auscultation without rales, wheezing or rhonchi  ABDOMEN: Soft, non-tender, non-distended EXTREMITIES:  No edema; No deformity    Wt Readings from Last 3 Encounters:  12/14/23 99.7 kg  12/12/23 103.4 kg  09/06/23 103 kg     EKG today demonstrates  Afib Vent. rate 60 BPM PR interval * ms QRS duration 94 ms QT/QTcB 426/426 ms   Echo 04/17/19 demonstrated  Severely dilated LV, EF 55-60%, normal LA size  Epic records are reviewed at length today   CHA2DS2-VASc Score = 3  The patient's score is based upon: CHF History: 0 HTN History: 1 Diabetes History: 1 Stroke History: 0 Vascular Disease History: 0 Age Score: 1 Gender Score: 0       ASSESSMENT AND PLAN: Persistent Atrial Fibrillation (ICD10:  I48.19) The patient's CHA2DS2-VASc score is 3, indicating a 3.2% annual risk of stroke.   S/p dofetilide  loading 12/2019 Patient back in persistent afib, symptomatic. We discussed rhythm control options. Will plan for DCCV.  Continue dofetilide  500 mcg BID Continue diltiazem  180 mg daily until DCCV then resume amlodipine  10 mg daily. Continue Xarelto  20 mg daily  Secondary Hypercoagulable State (ICD10:  D68.69) The patient is at significant risk for stroke/thromboembolism based upon his CHA2DS2-VASc Score of 3.  Continue Rivaroxaban  (Xarelto ). No bleeding issues.   High Risk Medication Monitoring (ICD 10: Z79.899) QT interval on ECG acceptable for dofetilide  monitoring. Recent labs reviewed.   HTN Stable on current regimen   Follow up in the AF clinic post DCCV.    Informed Consent   Shared Decision Making/Informed Consent The risks (stroke, cardiac arrhythmias rarely resulting in the need for a temporary or permanent pacemaker, skin irritation or burns and complications associated with conscious sedation including aspiration, arrhythmia, respiratory failure and death), benefits (restoration of normal sinus rhythm) and alternatives of a direct current cardioversion were explained in detail to [name] and he agrees to proceed.       Daril Kicks PA-C Afib Clinic [facility] [address] [place], [place] [address] [phone] 12/14/2023 10:04 AM

## 2023-12-14 NOTE — Patient Instructions (Signed)
 Hold Jardiance after June 27 may resume after procedure  Stop Diltiazem  day of procedure 12/18/23 and restart amlodipine  10mg  day of  12/18/23   Cardioversion scheduled for: July 1 Tuesday   - Arrive at the [facility] ([address])  and check in with ADMITTING at 8:30   - Do not eat or drink anything after midnight the night prior to your procedure.   - Take all your morning medication (except diabetic medications) with a sip of water prior to arrival.  - Do NOT miss any doses of your blood thinner - if you should miss a dose or take a dose more than 4 hours late -- please notify our office immediately.  - You will not be able to drive home after your procedure. Please ensure you have a responsible adult to drive you home. You will need someone with you for 24 hours post procedure.     - Expect to be in the procedural area approximately 2 hours.   - If you feel as if you go back into normal rhythm prior to scheduled cardioversion, please notify our office immediately.   If your procedure is canceled in the cardioversion suite you will be charged a cancellation fee.       Hold below medications 72 hours prior to scheduled procedure/anesthesia. Restart medication on the following day after scheduled procedure/anesthesia   Empagliflozin (Jardiance)     For those patients who have a scheduled procedure/anesthesia on the same day of the week as their dose, hold the medication on the day of surgery.  They can take their scheduled dose the week before.  **Patients on the above medications scheduled for elective procedures that have not held the medication for the appropriate amount of time are at risk of cancellation or change in the anesthetic plan.

## 2023-12-17 NOTE — Progress Notes (Signed)
 Pt called for pre procedure instructions. Arrival time 0800 NPO after midnight explained Instructed to take am meds with sip of water and confirmed blood thinner consistency, xarelto . Instructed pt need for ride home tomorrow and have responsible adult with them for 24 hrs post procedure.

## 2023-12-18 ENCOUNTER — Encounter (HOSPITAL_COMMUNITY)
Admission: RE | Disposition: A | Discharge status: Home / Self Care | Payer: Self-pay | Source: Home / Self Care | Attending: Cardiology

## 2023-12-18 ENCOUNTER — Ambulatory Visit (HOSPITAL_COMMUNITY)

## 2023-12-18 ENCOUNTER — Ambulatory Visit (HOSPITAL_COMMUNITY)
Admission: RE | Admit: 2023-12-18 | Discharge: 2023-12-18 | Disposition: A | Discharge status: Home / Self Care | Attending: Cardiology | Admitting: Cardiology

## 2023-12-18 ENCOUNTER — Encounter (HOSPITAL_COMMUNITY): Payer: Self-pay | Admitting: Cardiology

## 2023-12-18 ENCOUNTER — Other Ambulatory Visit: Payer: Self-pay

## 2023-12-18 DIAGNOSIS — D6869 Other thrombophilia: Secondary | ICD-10-CM | POA: Insufficient documentation

## 2023-12-18 DIAGNOSIS — Z79899 Other long term (current) drug therapy: Secondary | ICD-10-CM | POA: Insufficient documentation

## 2023-12-18 DIAGNOSIS — I10 Essential (primary) hypertension: Secondary | ICD-10-CM

## 2023-12-18 DIAGNOSIS — I4891 Unspecified atrial fibrillation: Secondary | ICD-10-CM | POA: Diagnosis not present

## 2023-12-18 DIAGNOSIS — Z7984 Long term (current) use of oral hypoglycemic drugs: Secondary | ICD-10-CM | POA: Insufficient documentation

## 2023-12-18 DIAGNOSIS — Z87891 Personal history of nicotine dependence: Secondary | ICD-10-CM | POA: Diagnosis not present

## 2023-12-18 DIAGNOSIS — I4819 Other persistent atrial fibrillation: Secondary | ICD-10-CM | POA: Insufficient documentation

## 2023-12-18 DIAGNOSIS — E119 Type 2 diabetes mellitus without complications: Secondary | ICD-10-CM | POA: Insufficient documentation

## 2023-12-18 DIAGNOSIS — Z7901 Long term (current) use of anticoagulants: Secondary | ICD-10-CM | POA: Insufficient documentation

## 2023-12-18 HISTORY — PX: CARDIOVERSION: EP1203

## 2023-12-18 LAB — GLUCOSE, CAPILLARY: Glucose-Capillary: 90 mg/dL (ref 70–99)

## 2023-12-18 SURGERY — CARDIOVERSION (CATH LAB)
Anesthesia: Monitor Anesthesia Care

## 2023-12-18 MED ORDER — PROPOFOL 10 MG/ML IV BOLUS
INTRAVENOUS | Status: DC | PRN
  Administered 2023-12-18: 60 mg via INTRAVENOUS

## 2023-12-18 MED ORDER — LIDOCAINE 2% (20 MG/ML) 5 ML SYRINGE
INTRAMUSCULAR | Status: DC | PRN
  Administered 2023-12-18: 40 mg via INTRAVENOUS

## 2023-12-18 SURGICAL SUPPLY — 1 items: PAD DEFIB RADIO PHYSIO CONN (PAD) ×1 IMPLANT

## 2023-12-18 NOTE — Interval H&P Note (Signed)
 History and Physical Interval Note:  12/18/2023 7:41 AM  Frank Chase  has presented today for surgery, with the diagnosis of AFIB.  The various methods of treatment have been discussed with the patient and family. After consideration of risks, benefits and other options for treatment, the patient has consented to  Procedure(s): CARDIOVERSION (N/A) as a surgical intervention.  The patient's history has been reviewed, patient examined, no change in status, stable for surgery.  I have reviewed the patient's chart and labs.  Questions were answered to the patient's satisfaction.     [employer]

## 2023-12-18 NOTE — Anesthesia Postprocedure Evaluation (Signed)
 Anesthesia Post Note  Patient: Frank Chase  Procedure(s) Performed: CARDIOVERSION     Patient location during evaluation: PACU Anesthesia Type: General Level of consciousness: awake and alert Pain management: pain level controlled Vital Signs Assessment: post-procedure vital signs reviewed and stable Respiratory status: spontaneous breathing, nonlabored ventilation, respiratory function stable and patient connected to nasal cannula oxygen Cardiovascular status: blood pressure returned to baseline and stable Postop Assessment: no apparent nausea or vomiting Anesthetic complications: no  No notable events documented.  Last Vitals:  Vitals:   12/18/23 0915 12/18/23 0920  BP: 136/81 131/77  Pulse: (!) 52 (!) 55  Resp: 15 14  Temp:    SpO2: 91% 92%    Last Pain:  Vitals:   12/18/23 0915  TempSrc:   PainSc: 0-No pain                 Franky JONETTA Bald

## 2023-12-18 NOTE — Transfer of Care (Signed)
 Immediate Anesthesia Transfer of Care Note  Patient: [name]  Procedure(s) Performed: CARDIOVERSION  Patient Location: Cath Lab  Anesthesia Type:General  Level of Consciousness: drowsy  Airway & Oxygen Therapy: Patient Spontanous Breathing and Patient connected to nasal cannula oxygen  Post-op Assessment: Report given to RN and Post -op Vital signs reviewed and stable  Post vital signs: Reviewed and stable  Last Vitals:  Vitals Value Taken Time  BP 118/81 12/18/23 08:30  Temp    Pulse 70 12/18/23 08:32  Resp 17 12/18/23 08:32  SpO2 96 % 12/18/23 08:32  Vitals shown include unfiled device data.  Last Pain:  Vitals:   12/18/23 0734  TempSrc: Temporal         Complications: No notable events documented.

## 2023-12-18 NOTE — Anesthesia Preprocedure Evaluation (Signed)
 Anesthesia Evaluation  Patient identified by MRN, date of birth, ID band Patient awake    Reviewed: Allergy & Precautions, NPO status , Patient's Chart, lab work & pertinent test results  Airway Mallampati: III       Dental no notable dental hx.    Pulmonary former smoker   Pulmonary exam normal        Cardiovascular hypertension, Pt. on medications + dysrhythmias Atrial Fibrillation  Rhythm:Irregular Rate:Abnormal  Gated Study:   There was no ST segment deviation noted during stress.  Nuclear stress EF: 49%. This is likely inaccurate as patient was in atrial fibrillation during study which would affect gating  Defect 1: There is a small defect of mild severity present in the basal inferior, mid inferior and apical inferior location.  Findings consistent with ischemia.  This is a low risk study.    Neuro/Psych negative neurological ROS  negative psych ROS   GI/Hepatic negative GI ROS, Neg liver ROS,,,  Endo/Other  diabetes, Type 2, Oral Hypoglycemic Agents    Renal/GU negative Renal ROS     Musculoskeletal negative musculoskeletal ROS (+)    Abdominal   Peds  Hematology negative hematology ROS (+)   Anesthesia Other Findings   Reproductive/Obstetrics                              Anesthesia Physical Anesthesia Plan  ASA: 3  Anesthesia Plan: General   Post-op Pain Management: Minimal or no pain anticipated   Induction: Intravenous  PONV Risk Score and Plan: 0 and Propofol  infusion  Airway Management Planned: Natural Airway and Nasal Cannula  Additional Equipment: None  Intra-op Plan:   Post-operative Plan:   Informed Consent: I have reviewed the patients History and Physical, chart, labs and discussed the procedure including the risks, benefits and alternatives for the proposed anesthesia with the patient or authorized representative who has indicated his/her  understanding and acceptance.       Plan Discussed with: CRNA  Anesthesia Plan Comments:          Anesthesia Quick Evaluation

## 2023-12-18 NOTE — CV Procedure (Signed)
    Electrical Cardioversion Procedure Note Frank Chase [id number] [birth year]  Procedure: Electrical Cardioversion Indications:  Atrial Fibrillation  Time Out: Verified patient identification, verified procedure,medications/allergies/relevent history reviewed, required imaging and test results available.  Performed  Procedure Details  The patient was NPO after midnight. Anesthesia was administered at the beside  by Dr.Hollis with propofol .  Cardioversion was performed with synchronized biphasic defibrillation via AP pads with 200 joules.  1 attempt(s) were performed.  The patient converted to normal sinus rhythm. The patient tolerated the procedure well   IMPRESSION:  Successful cardioversion of atrial fibrillation On dofetilide     Frank Chase 12/18/2023, 8:43 AM

## 2023-12-24 ENCOUNTER — Other Ambulatory Visit: Payer: Self-pay | Admitting: Cardiology

## 2023-12-24 DIAGNOSIS — I4819 Other persistent atrial fibrillation: Secondary | ICD-10-CM

## 2023-12-24 NOTE — Telephone Encounter (Signed)
 Prescription refill request for Xarelto  received.  Indication: AF Last office visit: 12/14/23  C Fenton PA Weight: 99.7kg Age: 73 Scr: 1.40 on 12/12/23  Epic CrCl: 66.27  Based on above findings Xarelto  20mg  daily is the appropriate dose.  Refill approved.

## 2024-01-01 ENCOUNTER — Encounter (HOSPITAL_COMMUNITY): Payer: Self-pay | Admitting: Physician Assistant

## 2024-01-01 ENCOUNTER — Ambulatory Visit (HOSPITAL_COMMUNITY)
Admission: RE | Admit: 2024-01-01 | Discharge: 2024-01-01 | Disposition: A | Discharge status: Home / Self Care | Source: Ambulatory Visit | Attending: Physician Assistant | Admitting: Physician Assistant

## 2024-01-01 VITALS — BP 132/92 | HR 55 | Ht 70.0 in | Wt 220.6 lb

## 2024-01-01 DIAGNOSIS — Z5181 Encounter for therapeutic drug level monitoring: Secondary | ICD-10-CM

## 2024-01-01 DIAGNOSIS — I4819 Other persistent atrial fibrillation: Secondary | ICD-10-CM

## 2024-01-01 DIAGNOSIS — D6869 Other thrombophilia: Secondary | ICD-10-CM | POA: Diagnosis not present

## 2024-01-01 DIAGNOSIS — Z79899 Other long term (current) drug therapy: Secondary | ICD-10-CM

## 2024-01-01 NOTE — Progress Notes (Signed)
 Primary Care Physician: System, Provider Not In Primary Cardiologist: Dr Lavona  Primary Electrophysiologist: Dr Inocencio Referring Physician: Dr Lavona Rattler Frank Chase is a 73 y.o. male with a history of HTN, DM and persistent atrial fibrillation who presents for follow up in the [facility]. The patient was initially diagnosed with atrial fibrillation 04/2019 incidentally by the patient's wife who noted an irregular heart beat. He presented to his PCP who confirmed afib on ECG. He was started on Xarelto  and underwent DCCV on 05/12/19. Patient did well until follow up 11/2019 when he noted weakness and SOB with exertion and ECG showed he was back in afib. Patient is on Xarelto  for stroke prevention.  Patient is s/p dofetilide  loading 7/20-7/23/21. He underwent DCCV on 01/08/20.   Patient was seen at the ED 12/12/23 with generalized weakness and was found to be back in afib. He was started on diltiazem . He underwent DCCV on 12/18/23.  Patient returns for follow up for atrial fibrillation. He remains in SR and his symptoms of fatigue have resolved. No bleeding issues on anticoagulation.   Today, he  denies symptoms of palpitations, chest pain, shortness of breath, orthopnea, PND, lower extremity edema, dizziness, presyncope, syncope, snoring, daytime somnolence, bleeding, or neurologic sequela. The patient is tolerating medications without difficulties and is otherwise without complaint today.    Atrial Fibrillation Risk Factors:  he does not have symptoms or diagnosis of sleep apnea. he does not have a history of rheumatic fever.   Atrial Fibrillation Management history:  Previous antiarrhythmic drugs: dofetilide  Previous cardioversions: 05/12/19, 01/08/20, 12/18/23 Previous ablations: none Anticoagulation history: Xarelto    Past Medical History:  Diagnosis Date   Atrial fibrillation (HCC)    Diabetes mellitus without complication (HCC)    Hypertension      Current Outpatient Medications  Medication Sig Dispense Refill   amLODipine  (NORVASC ) 10 MG tablet Take 1 tablet (10 mg total) by mouth at bedtime. Resume 12/18/23     COMBIGAN 0.2-0.5 % ophthalmic solution Place 1 drop into the left eye 2 (two) times daily.     diltiazem  (CARDIZEM  CD) 180 MG 24 hr capsule Take 1 capsule (180 mg total) by mouth daily for 4 days.     dofetilide  (TIKOSYN ) 500 MCG capsule TAKE ONE CAPSULE BY MOUTH TWICE DAILY 180 capsule 3   Glucosamine-Chondroitin (COSAMIN DS PO) Take 1 tablet by mouth at bedtime.     JARDIANCE 10 MG TABS tablet Take 10 mg by mouth daily.     Magnesium  Oxide 400 MG CAPS Take 1 capsule (400 mg total) by mouth daily. 90 capsule 3   metFORMIN  (GLUCOPHAGE ) 500 MG tablet Take by mouth 2 (two) times daily with a meal.     olmesartan  (BENICAR ) 40 MG tablet TAKE ONE TABLET BY MOUTH ONCE DAILY 90 tablet 3   potassium chloride  (KLOR-CON ) 10 MEQ tablet TAKE ONE TABLET BY MOUTH DAILY 90 tablet 3   Travoprost, BAK Free, (TRAVATAN) 0.004 % SOLN ophthalmic solution Place 1 drop into both eyes at bedtime.      TRUE METRIX BLOOD GLUCOSE TEST test strip      XARELTO  20 MG TABS tablet TAKE 1 TABLET EVERY EVENING 90 tablet 1   No current facility-administered medications for this encounter.    ROS- All systems are reviewed and negative except as per the HPI above.  Physical Exam: Vitals:   01/01/24 1054  BP: (!) 132/92  Pulse: (!) 55  Weight: 100.1 kg  Height:  5' 10 (1.778 m)     GEN: Well nourished, well developed in no acute distress CARDIAC: Regular rate and rhythm, no murmurs, rubs, gallops RESPIRATORY:  Clear to auscultation without rales, wheezing or rhonchi  ABDOMEN: Soft, non-tender, non-distended EXTREMITIES:  No edema; No deformity    Wt Readings from Last 3 Encounters:  01/01/24 100.1 kg  12/18/23 98.9 kg  12/14/23 99.7 kg    EKG today demonstrates  SB Vent. rate 55 BPM PR interval 128 ms QRS duration 92 ms QT/QTcB 462/441  ms   Echo 04/17/19 demonstrated  Severely dilated LV, EF 55-60%, normal LA size  Epic records are reviewed at length today   CHA2DS2-VASc Score = 3  The patient's score is based upon: CHF History: 0 HTN History: 1 Diabetes History: 1 Stroke History: 0 Vascular Disease History: 0 Age Score: 1 Gender Score: 0       ASSESSMENT AND PLAN: Persistent Atrial Fibrillation (ICD10:  I48.19) The patient's CHA2DS2-VASc score is 3, indicating a 3.2% annual risk of stroke.   S/p dofetilide  loading 12/2019 S/p DCCV 12/18/23 Patient appears to be maintaining SR Continue dofetilide  500 mcg BID Continue Xarelto  20 mg daily  Secondary Hypercoagulable State (ICD10:  D68.69) The patient is at significant risk for stroke/thromboembolism based upon his CHA2DS2-VASc Score of 3.  Continue Rivaroxaban  (Xarelto ). No bleeding issues.   High Risk Medication Monitoring (ICD 10: Z79.899) QT interval on ECG acceptable for dofetilide  monitoring. Will repeat labs at next visit.   HTN Stable on current regimen   Follow up in the AF clinic in 6 months.     Daril Kicks PA-C Afib Clinic [facility] [address] [place], [place] [address] [phone] 01/01/2024 11:15 AM

## 2024-01-30 DIAGNOSIS — E1139 Type 2 diabetes mellitus with other diabetic ophthalmic complication: Secondary | ICD-10-CM | POA: Diagnosis not present

## 2024-01-30 DIAGNOSIS — I10 Essential (primary) hypertension: Secondary | ICD-10-CM | POA: Diagnosis not present

## 2024-01-30 DIAGNOSIS — Z6831 Body mass index (BMI) 31.0-31.9, adult: Secondary | ICD-10-CM | POA: Diagnosis not present

## 2024-01-30 DIAGNOSIS — E1129 Type 2 diabetes mellitus with other diabetic kidney complication: Secondary | ICD-10-CM | POA: Diagnosis not present

## 2024-01-30 DIAGNOSIS — E782 Mixed hyperlipidemia: Secondary | ICD-10-CM | POA: Diagnosis not present

## 2024-01-30 DIAGNOSIS — I4819 Other persistent atrial fibrillation: Secondary | ICD-10-CM | POA: Diagnosis not present

## 2024-01-30 DIAGNOSIS — R809 Proteinuria, unspecified: Secondary | ICD-10-CM | POA: Diagnosis not present

## 2024-02-28 DIAGNOSIS — Z961 Presence of intraocular lens: Secondary | ICD-10-CM | POA: Diagnosis not present

## 2024-02-28 DIAGNOSIS — Z0100 Encounter for examination of eyes and vision without abnormal findings: Secondary | ICD-10-CM | POA: Diagnosis not present

## 2024-02-28 DIAGNOSIS — H401123 Primary open-angle glaucoma, left eye, severe stage: Secondary | ICD-10-CM | POA: Diagnosis not present

## 2024-02-28 DIAGNOSIS — E119 Type 2 diabetes mellitus without complications: Secondary | ICD-10-CM | POA: Diagnosis not present

## 2024-03-26 DIAGNOSIS — H4032X3 Glaucoma secondary to eye trauma, left eye, severe stage: Secondary | ICD-10-CM | POA: Diagnosis not present

## 2024-03-29 ENCOUNTER — Other Ambulatory Visit: Payer: Self-pay | Admitting: Cardiology

## 2024-05-06 DIAGNOSIS — E782 Mixed hyperlipidemia: Secondary | ICD-10-CM | POA: Diagnosis not present

## 2024-05-06 DIAGNOSIS — I10 Essential (primary) hypertension: Secondary | ICD-10-CM | POA: Diagnosis not present

## 2024-05-06 DIAGNOSIS — I4819 Other persistent atrial fibrillation: Secondary | ICD-10-CM | POA: Diagnosis not present

## 2024-05-06 DIAGNOSIS — D582 Other hemoglobinopathies: Secondary | ICD-10-CM | POA: Diagnosis not present

## 2024-05-06 DIAGNOSIS — Z6832 Body mass index (BMI) 32.0-32.9, adult: Secondary | ICD-10-CM | POA: Diagnosis not present

## 2024-05-06 DIAGNOSIS — Z23 Encounter for immunization: Secondary | ICD-10-CM | POA: Diagnosis not present

## 2024-05-06 DIAGNOSIS — R809 Proteinuria, unspecified: Secondary | ICD-10-CM | POA: Diagnosis not present

## 2024-05-06 DIAGNOSIS — E1139 Type 2 diabetes mellitus with other diabetic ophthalmic complication: Secondary | ICD-10-CM | POA: Diagnosis not present

## 2024-05-06 DIAGNOSIS — E1129 Type 2 diabetes mellitus with other diabetic kidney complication: Secondary | ICD-10-CM | POA: Diagnosis not present

## 2024-05-06 LAB — LAB REPORT - SCANNED
A1c: 6.4
Albumin, Urine POC: 230
Creatinine, POC: 153 mg/dL
EGFR: 79
Microalb Creat Ratio: 150
TSH: 1.23 (ref 0.41–5.90)

## 2024-05-13 DIAGNOSIS — L820 Inflamed seborrheic keratosis: Secondary | ICD-10-CM | POA: Diagnosis not present

## 2024-05-13 DIAGNOSIS — L578 Other skin changes due to chronic exposure to nonionizing radiation: Secondary | ICD-10-CM | POA: Diagnosis not present

## 2024-05-13 DIAGNOSIS — L814 Other melanin hyperpigmentation: Secondary | ICD-10-CM | POA: Diagnosis not present

## 2024-06-13 ENCOUNTER — Other Ambulatory Visit: Payer: Self-pay | Admitting: Cardiology

## 2024-06-13 DIAGNOSIS — I4819 Other persistent atrial fibrillation: Secondary | ICD-10-CM

## 2024-06-13 NOTE — Telephone Encounter (Signed)
 Prescription refill request for Xarelto  received.  Indication:afib Last office visit:7/25 Weight:100.1  kg Age:73 Scr:1.40  6/25 CrCl:65.54  ml/min  Prescription refilled

## 2024-07-01 ENCOUNTER — Ambulatory Visit (HOSPITAL_COMMUNITY)
Admission: RE | Admit: 2024-07-01 | Discharge: 2024-07-01 | Disposition: A | Discharge status: Home / Self Care | Source: Ambulatory Visit | Attending: Physician Assistant | Admitting: Physician Assistant

## 2024-07-01 VITALS — BP 130/72 | HR 51 | Ht 70.0 in | Wt 227.8 lb

## 2024-07-01 DIAGNOSIS — I4891 Unspecified atrial fibrillation: Secondary | ICD-10-CM

## 2024-07-01 DIAGNOSIS — Z79899 Other long term (current) drug therapy: Secondary | ICD-10-CM

## 2024-07-01 DIAGNOSIS — I4819 Other persistent atrial fibrillation: Secondary | ICD-10-CM | POA: Diagnosis not present

## 2024-07-01 DIAGNOSIS — D6869 Other thrombophilia: Secondary | ICD-10-CM | POA: Diagnosis not present

## 2024-07-01 DIAGNOSIS — Z5181 Encounter for therapeutic drug level monitoring: Secondary | ICD-10-CM

## 2024-07-01 NOTE — Progress Notes (Signed)
 "   Primary Care Physician: System, Provider Not In Primary Cardiologist: Dr Lavona  Primary Electrophysiologist: Dr Inocencio Referring Physician: Dr Lavona Rattler Frank Chase is a 74 y.o. male with a history of HTN, DM and persistent atrial fibrillation who presents for follow up in the [facility]. The patient was initially diagnosed with atrial fibrillation 04/2019 incidentally by the patient's wife who noted an irregular heart beat. He presented to his PCP who confirmed afib on ECG. He was started on Xarelto  and underwent DCCV on 05/12/19. Patient did well until follow up 11/2019 when he noted weakness and SOB with exertion and ECG showed he was back in afib. Patient is on Xarelto  for stroke prevention.  Patient is s/p dofetilide  loading 7/20-7/23/21. He underwent DCCV on 01/08/20.   Patient was seen at the ED 12/12/23 with generalized weakness and was found to be back in afib. He was started on diltiazem . He underwent DCCV on 12/18/23.  Patient returns for follow up for atrial fibrillation and dofetilide  monitoring. He remains in SR today and feels well. He denies any interim symptoms of afib. No bleeding issues on anticoagulation.    Today, he  denies symptoms of palpitations, chest pain, shortness of breath, orthopnea, PND, lower extremity edema, dizziness, presyncope, syncope, snoring, daytime somnolence, bleeding, or neurologic sequela. The patient is tolerating medications without difficulties and is otherwise without complaint today.    Atrial Fibrillation Risk Factors:  he does not have symptoms or diagnosis of sleep apnea. he does not have a history of rheumatic fever.   Atrial Fibrillation Management history:  Previous antiarrhythmic drugs: dofetilide  Previous cardioversions: 05/12/19, 01/08/20, 12/18/23 Previous ablations: none Anticoagulation history: Xarelto    Past Medical History:  Diagnosis Date   Atrial fibrillation (HCC)    Diabetes  mellitus without complication (HCC)    Hypertension     Current Outpatient Medications  Medication Sig Dispense Refill   amLODipine  (NORVASC ) 10 MG tablet Take 1 tablet (10 mg total) by mouth at bedtime. Resume 12/18/23     COMBIGAN 0.2-0.5 % ophthalmic solution Place 1 drop into the left eye 2 (two) times daily.     dofetilide  (TIKOSYN ) 500 MCG capsule TAKE ONE CAPSULE BY MOUTH TWICE DAILY 180 capsule 3   Glucosamine-Chondroitin (COSAMIN DS PO) Take 1 tablet by mouth at bedtime.     JARDIANCE 10 MG TABS tablet Take 10 mg by mouth daily.     Magnesium  Oxide 400 MG CAPS Take 1 capsule (400 mg total) by mouth daily. (Patient taking differently: Take 500 mg by mouth daily.) 90 capsule 3   metFORMIN  (GLUCOPHAGE ) 500 MG tablet Take by mouth 2 (two) times daily with a meal.     olmesartan  (BENICAR ) 40 MG tablet TAKE ONE TABLET BY MOUTH ONCE DAILY 90 tablet 1   potassium chloride  (KLOR-CON ) 10 MEQ tablet TAKE ONE TABLET BY MOUTH DAILY 90 tablet 1   Travoprost, BAK Free, (TRAVATAN) 0.004 % SOLN ophthalmic solution Place 1 drop into both eyes at bedtime.      TRUE METRIX BLOOD GLUCOSE TEST test strip      vitamin D3 (CHOLECALCIFEROL) 25 MCG tablet Take 1,000 Units by mouth daily.     XARELTO  20 MG TABS tablet TAKE 1 TABLET EVERY EVENING 90 tablet 3   No current facility-administered medications for this encounter.    ROS- All systems are reviewed and negative except as per the HPI above.  Physical Exam: Vitals:   07/01/24 1115  BP: 130/72  Pulse: (!) 51  Weight: 103.3 kg  Height: 5' 10 (1.778 m)     GEN: Well nourished, well developed in no acute distress CARDIAC: Regular rate and rhythm, no murmurs, rubs, gallops RESPIRATORY:  Clear to auscultation without rales, wheezing or rhonchi  ABDOMEN: Soft, non-tender, non-distended EXTREMITIES:  No edema; No deformity    Wt Readings from Last 3 Encounters:  07/01/24 103.3 kg  01/01/24 100.1 kg  12/18/23 98.9 kg    EKG  Interpretation Date/Time:  Tuesday July 01 2024 11:31:11 EST Ventricular Rate:  51 PR Interval:  172 QRS Duration:  94 QT Interval:  456 QTC Calculation: 420 R Axis:   45  Text Interpretation: Sinus bradycardia Otherwise normal ECG When compared with ECG of 01-Jan-2024 10:56, No significant change was found Confirmed by Juliahna Wiswell ([id number]) on 07/01/2024 11:36:56 AM    Echo 04/17/19 demonstrated  Severely dilated LV, EF 55-60%, normal LA size  Epic records are reviewed at length today   CHA2DS2-VASc Score = 3  The patient's score is based upon: CHF History: 0 HTN History: 1 Diabetes History: 1 Stroke History: 0 Vascular Disease History: 0 Age Score: 1 Gender Score: 0       ASSESSMENT AND PLAN: Persistent Atrial Fibrillation (ICD10:  I48.19) The patient's CHA2DS2-VASc score is 3, indicating a 3.2% annual risk of stroke.   S/p dofetilide  loading 12/2019 Patient appears to be maintaining SR Continue dofetilide  500 mcg BID Continue Xarelto  20 mg daily  Secondary Hypercoagulable State (ICD10:  D68.69) The patient is at significant risk for stroke/thromboembolism based upon his CHA2DS2-VASc Score of 3.  Continue Rivaroxaban  (Xarelto ). No bleeding issues.   High Risk Medication Monitoring (ICD 10: [id number]) Patient requires ongoing monitoring for anti-arrhythmic medication which has the potential to cause life threatening arrhythmias. QT interval on ECG acceptable for dofetilide  monitoring. Check bmet/mag today.     HTN Stable on current regimen   Follow up in the AF clinic in 6 months.    Daril Kicks PA-C Afib Clinic [facility] [address] [place], [place] [address] [phone] 07/01/2024 11:39 AM "

## 2024-07-02 ENCOUNTER — Ambulatory Visit (HOSPITAL_COMMUNITY): Payer: Self-pay | Admitting: Physician Assistant

## 2024-07-02 LAB — BASIC METABOLIC PANEL WITH GFR
BUN/Creatinine Ratio: 9 — ABNORMAL LOW (ref 10–24)
BUN: 11 mg/dL (ref 8–27)
CO2: 22 mmol/L (ref 20–29)
Calcium: 9.3 mg/dL (ref 8.6–10.2)
Chloride: 98 mmol/L (ref 96–106)
Creatinine, Ser: 1.2 mg/dL (ref 0.76–1.27)
Glucose: 99 mg/dL (ref 70–99)
Potassium: 4.2 mmol/L (ref 3.5–5.2)
Sodium: 135 mmol/L (ref 134–144)
eGFR: 63 mL/min/1.73

## 2024-07-02 LAB — MAGNESIUM: Magnesium: 2.1 mg/dL (ref 1.6–2.3)

## 2024-07-15 ENCOUNTER — Ambulatory Visit (HOSPITAL_COMMUNITY)
Admission: RE | Admit: 2024-07-15 | Discharge: 2024-07-15 | Disposition: A | Discharge status: Home / Self Care | Source: Ambulatory Visit | Attending: Physician Assistant | Admitting: Physician Assistant

## 2024-07-15 ENCOUNTER — Telehealth: Payer: Self-pay

## 2024-07-15 VITALS — BP 114/62 | HR 96 | Ht 70.0 in | Wt 226.8 lb

## 2024-07-15 DIAGNOSIS — D6869 Other thrombophilia: Secondary | ICD-10-CM | POA: Diagnosis not present

## 2024-07-15 DIAGNOSIS — Z79899 Other long term (current) drug therapy: Secondary | ICD-10-CM

## 2024-07-15 DIAGNOSIS — Z5181 Encounter for therapeutic drug level monitoring: Secondary | ICD-10-CM

## 2024-07-15 DIAGNOSIS — I4891 Unspecified atrial fibrillation: Secondary | ICD-10-CM | POA: Diagnosis not present

## 2024-07-15 DIAGNOSIS — I4819 Other persistent atrial fibrillation: Secondary | ICD-10-CM | POA: Diagnosis not present

## 2024-07-15 NOTE — Progress Notes (Signed)
 "   Primary Care Physician: System, Provider Not In Primary Cardiologist: Dr Lavona  Primary Electrophysiologist: Dr Inocencio Referring Physician: Dr Lavona Rattler Frank Chase is a 74 y.o. male with a history of HTN, DM and persistent atrial fibrillation who presents for follow up in the [facility]. The patient was initially diagnosed with atrial fibrillation 04/2019 incidentally by the patient's wife who noted an irregular heart beat. He presented to his PCP who confirmed afib on ECG. He was started on Xarelto  and underwent DCCV on 05/12/19. Patient did well until follow up 11/2019 when he noted weakness and SOB with exertion and ECG showed he was back in afib. Patient is on Xarelto  for stroke prevention.  Patient is s/p dofetilide  loading 7/20-7/23/21. He underwent DCCV on 01/08/20. Patient was seen at the ED 12/12/23 with generalized weakness and was found to be back in afib. He was started on diltiazem . He underwent DCCV on 12/18/23.  Patient returns for follow up for atrial fibrillation and dofetilide  monitoring. Patient reports that he went back into afib on 07/12/24 with symptoms of intermittent lightheadedness. There were no specific triggers that he could identify. No bleeding issues on anticoagulation.   Today, he  denies symptoms of palpitations, chest pain, shortness of breath, orthopnea, PND, lower extremity edema, presyncope, syncope, snoring, daytime somnolence, bleeding, or neurologic sequela. The patient is tolerating medications without difficulties and is otherwise without complaint today.    Atrial Fibrillation Risk Factors:  he does not have symptoms or diagnosis of sleep apnea. he does not have a history of rheumatic fever.   Atrial Fibrillation Management history:  Previous antiarrhythmic drugs: dofetilide  Previous cardioversions: 05/12/19, 01/08/20, 12/18/23 Previous ablations: none Anticoagulation history: Xarelto    Past Medical History:   Diagnosis Date   Atrial fibrillation (HCC)    Diabetes mellitus without complication (HCC)    Hypertension     Current Outpatient Medications  Medication Sig Dispense Refill   amLODipine  (NORVASC ) 10 MG tablet Take 1 tablet (10 mg total) by mouth at bedtime. Resume 12/18/23     COMBIGAN 0.2-0.5 % ophthalmic solution Place 1 drop into the left eye 2 (two) times daily.     dofetilide  (TIKOSYN ) 500 MCG capsule TAKE ONE CAPSULE BY MOUTH TWICE DAILY 180 capsule 3   Glucosamine-Chondroitin (COSAMIN DS PO) Take 1 tablet by mouth at bedtime.     JARDIANCE 10 MG TABS tablet Take 10 mg by mouth daily.     Magnesium  Oxide 400 MG CAPS Take 1 capsule (400 mg total) by mouth daily. (Patient taking differently: Take 500 mg by mouth daily.) 90 capsule 3   metFORMIN  (GLUCOPHAGE ) 500 MG tablet Take by mouth 2 (two) times daily with a meal.     olmesartan  (BENICAR ) 40 MG tablet TAKE ONE TABLET BY MOUTH ONCE DAILY 90 tablet 1   potassium chloride  (KLOR-CON ) 10 MEQ tablet TAKE ONE TABLET BY MOUTH DAILY 90 tablet 1   Travoprost, BAK Free, (TRAVATAN) 0.004 % SOLN ophthalmic solution Place 1 drop into both eyes at bedtime.      TRUE METRIX BLOOD GLUCOSE TEST test strip      vitamin D3 (CHOLECALCIFEROL) 25 MCG tablet Take 1,000 Units by mouth daily.     XARELTO  20 MG TABS tablet TAKE 1 TABLET EVERY EVENING 90 tablet 3   No current facility-administered medications for this encounter.    ROS- All systems are reviewed and negative except as per the HPI above.  Physical Exam: Vitals:   07/15/24  1056  BP: 114/62  Pulse: 96  Weight: 102.9 kg  Height: 5' 10 (1.778 m)    GEN: Well nourished, well developed in no acute distress CARDIAC: Irregularly irregular rate and rhythm, no murmurs, rubs, gallops RESPIRATORY:  Clear to auscultation without rales, wheezing or rhonchi  ABDOMEN: Soft, non-tender, non-distended EXTREMITIES:  No edema; No deformity    Wt Readings from Last 3 Encounters:  07/15/24 102.9 kg   07/01/24 103.3 kg  01/01/24 100.1 kg    EKG Interpretation Date/Time:  Tuesday July 15 2024 11:03:53 EST Ventricular Rate:  96 PR Interval:    QRS Duration:  92 QT Interval:  370 QTC Calculation: 467 R Axis:   59  Text Interpretation: Atrial fibrillation Abnormal ECG When compared with ECG of 01-Jul-2024 11:31, Atrial fibrillation has replaced Sinus rhythm Confirmed by Chantal Worthey ([id number]) on 07/15/2024 11:13:06 AM    Echo 04/17/19 demonstrated  Severely dilated LV, EF 55-60%, normal LA size  Epic records are reviewed at length today   CHA2DS2-VASc Score = 3  The patient's score is based upon: CHF History: 0 HTN History: 1 Diabetes History: 1 Stroke History: 0 Vascular Disease History: 0 Age Score: 1 Gender Score: 0       ASSESSMENT AND PLAN: Persistent Atrial Fibrillation (ICD10:  I48.19) The patient's CHA2DS2-VASc score is 3, indicating a 3.2% annual risk of stroke.   S/p dofetilide  loading 12/2019 Patient back in persistent afib.   Discussed treatment options today for AF including antiarrhythmic drug therapy (amiodarone), DCCV, and ablation. Seen with Dr Inocencio. Discussed risks, recovery and likelihood of success with each treatment strategy. Risk, benefits, and alternatives to EP study and ablation for afib were discussed. These risks include but are not limited to stroke, bleeding, vascular damage, tamponade, perforation, damage to the esophagus, lungs, phrenic nerve and other structures, pulmonary vein stenosis, worsening renal function, coronary vasospasm and death.  Discussed potential need for repeat ablation procedures and antiarrhythmic drugs after an initial ablation. The patient understands these risk and wishes to proceed today.    Check cbc today. Recent bmet reviewed.  Continue dofetilide  500 mcg BID Continue Xarelto  20 mg daily  Secondary Hypercoagulable State (ICD10:  D68.69) The patient is at significant risk for stroke/thromboembolism based upon  his CHA2DS2-VASc Score of 3.  Continue Rivaroxaban  (Xarelto ). No bleeding issues.   High Risk Medication Monitoring (ICD 10: U5195107) Patient requires ongoing monitoring for anti-arrhythmic medication which has the potential to cause life threatening arrhythmias. QT interval on ECG acceptable for dofetilide  monitoring.   HTN Stable on current regimen   Follow up for afib ablation on 07/17/24. AF clinic one month post ablation.    Daril Kicks PA-C Afib Clinic [facility] [address] [place], [place] [address] [phone] 07/15/2024 11:13 AM "

## 2024-07-15 NOTE — Patient Instructions (Addendum)
 Frank Chase

## 2024-07-15 NOTE — H&P (View-Only) (Signed)
 "   Primary Care Physician: System, Provider Not In Primary Cardiologist: Dr Lavona  Primary Electrophysiologist: Dr Inocencio Referring Physician: Dr Lavona Rattler Frank Chase is a 74 y.o. male with a history of HTN, DM and persistent atrial fibrillation who presents for follow up in the [facility]. The patient was initially diagnosed with atrial fibrillation 04/2019 incidentally by the patient's wife who noted an irregular heart beat. He presented to his PCP who confirmed afib on ECG. He was started on Xarelto  and underwent DCCV on 05/12/19. Patient did well until follow up 11/2019 when he noted weakness and SOB with exertion and ECG showed he was back in afib. Patient is on Xarelto  for stroke prevention.  Patient is s/p dofetilide  loading 7/20-7/23/21. He underwent DCCV on 01/08/20. Patient was seen at the ED 12/12/23 with generalized weakness and was found to be back in afib. He was started on diltiazem . He underwent DCCV on 12/18/23.  Patient returns for follow up for atrial fibrillation and dofetilide  monitoring. Patient reports that he went back into afib on 07/12/24 with symptoms of intermittent lightheadedness. There were no specific triggers that he could identify. No bleeding issues on anticoagulation.   Today, he  denies symptoms of palpitations, chest pain, shortness of breath, orthopnea, PND, lower extremity edema, presyncope, syncope, snoring, daytime somnolence, bleeding, or neurologic sequela. The patient is tolerating medications without difficulties and is otherwise without complaint today.    Atrial Fibrillation Risk Factors:  he does not have symptoms or diagnosis of sleep apnea. he does not have a history of rheumatic fever.   Atrial Fibrillation Management history:  Previous antiarrhythmic drugs: dofetilide  Previous cardioversions: 05/12/19, 01/08/20, 12/18/23 Previous ablations: none Anticoagulation history: Xarelto    Past Medical History:   Diagnosis Date   Atrial fibrillation (HCC)    Diabetes mellitus without complication (HCC)    Hypertension     Current Outpatient Medications  Medication Sig Dispense Refill   amLODipine  (NORVASC ) 10 MG tablet Take 1 tablet (10 mg total) by mouth at bedtime. Resume 12/18/23     COMBIGAN 0.2-0.5 % ophthalmic solution Place 1 drop into the left eye 2 (two) times daily.     dofetilide  (TIKOSYN ) 500 MCG capsule TAKE ONE CAPSULE BY MOUTH TWICE DAILY 180 capsule 3   Glucosamine-Chondroitin (COSAMIN DS PO) Take 1 tablet by mouth at bedtime.     JARDIANCE 10 MG TABS tablet Take 10 mg by mouth daily.     Magnesium  Oxide 400 MG CAPS Take 1 capsule (400 mg total) by mouth daily. (Patient taking differently: Take 500 mg by mouth daily.) 90 capsule 3   metFORMIN  (GLUCOPHAGE ) 500 MG tablet Take by mouth 2 (two) times daily with a meal.     olmesartan  (BENICAR ) 40 MG tablet TAKE ONE TABLET BY MOUTH ONCE DAILY 90 tablet 1   potassium chloride  (KLOR-CON ) 10 MEQ tablet TAKE ONE TABLET BY MOUTH DAILY 90 tablet 1   Travoprost, BAK Free, (TRAVATAN) 0.004 % SOLN ophthalmic solution Place 1 drop into both eyes at bedtime.      TRUE METRIX BLOOD GLUCOSE TEST test strip      vitamin D3 (CHOLECALCIFEROL) 25 MCG tablet Take 1,000 Units by mouth daily.     XARELTO  20 MG TABS tablet TAKE 1 TABLET EVERY EVENING 90 tablet 3   No current facility-administered medications for this encounter.    ROS- All systems are reviewed and negative except as per the HPI above.  Physical Exam: Vitals:   07/15/24  1056  BP: 114/62  Pulse: 96  Weight: 102.9 kg  Height: 5' 10 (1.778 m)    GEN: Well nourished, well developed in no acute distress CARDIAC: Irregularly irregular rate and rhythm, no murmurs, rubs, gallops RESPIRATORY:  Clear to auscultation without rales, wheezing or rhonchi  ABDOMEN: Soft, non-tender, non-distended EXTREMITIES:  No edema; No deformity    Wt Readings from Last 3 Encounters:  07/15/24 102.9 kg   07/01/24 103.3 kg  01/01/24 100.1 kg    EKG Interpretation Date/Time:  Tuesday July 15 2024 11:03:53 EST Ventricular Rate:  96 PR Interval:    QRS Duration:  92 QT Interval:  370 QTC Calculation: 467 R Axis:   59  Text Interpretation: Atrial fibrillation Abnormal ECG When compared with ECG of 01-Jul-2024 11:31, Atrial fibrillation has replaced Sinus rhythm Confirmed by Chantal Worthey ([id number]) on 07/15/2024 11:13:06 AM    Echo 04/17/19 demonstrated  Severely dilated LV, EF 55-60%, normal LA size  Epic records are reviewed at length today   CHA2DS2-VASc Score = 3  The patient's score is based upon: CHF History: 0 HTN History: 1 Diabetes History: 1 Stroke History: 0 Vascular Disease History: 0 Age Score: 1 Gender Score: 0       ASSESSMENT AND PLAN: Persistent Atrial Fibrillation (ICD10:  I48.19) The patient's CHA2DS2-VASc score is 3, indicating a 3.2% annual risk of stroke.   S/p dofetilide  loading 12/2019 Patient back in persistent afib.   Discussed treatment options today for AF including antiarrhythmic drug therapy (amiodarone), DCCV, and ablation. Seen with Dr Inocencio. Discussed risks, recovery and likelihood of success with each treatment strategy. Risk, benefits, and alternatives to EP study and ablation for afib were discussed. These risks include but are not limited to stroke, bleeding, vascular damage, tamponade, perforation, damage to the esophagus, lungs, phrenic nerve and other structures, pulmonary vein stenosis, worsening renal function, coronary vasospasm and death.  Discussed potential need for repeat ablation procedures and antiarrhythmic drugs after an initial ablation. The patient understands these risk and wishes to proceed today.    Check cbc today. Recent bmet reviewed.  Continue dofetilide  500 mcg BID Continue Xarelto  20 mg daily  Secondary Hypercoagulable State (ICD10:  D68.69) The patient is at significant risk for stroke/thromboembolism based upon  his CHA2DS2-VASc Score of 3.  Continue Rivaroxaban  (Xarelto ). No bleeding issues.   High Risk Medication Monitoring (ICD 10: [id number]) Patient requires ongoing monitoring for anti-arrhythmic medication which has the potential to cause life threatening arrhythmias. QT interval on ECG acceptable for dofetilide  monitoring.   HTN Stable on current regimen   Follow up for afib ablation on 07/17/24. AF clinic one month post ablation.    Daril Kicks PA-C [facility] [address] [place], [place] [address] [phone] 07/15/2024 11:13 AM "

## 2024-07-15 NOTE — Telephone Encounter (Signed)
-----   Message from Nurse Doreatha BROCKS, RN sent at 07/15/2024 11:48 AM EST ----- Regarding: 1/29 afib ablation  Precert:  MD: Camnitz Type of ablation: A-fib Diagnosis: afib CPT code: A-fib (06343) Ablation scheduled (date/time): 1/29 at 1:30pm  Procedure:  Added to calendar? Yes Orders entered? Yes Letter complete? Yes Scheduled with cath lab? Yes Any medications to hold? Yes (please list hold instructions): jardiance Labs ordered (CBC, BMET, PT/INR if on warfarin): Yes Mapping system: Doesn't matter CARTO/OPAL rep notified? No Cardiac CT needed? No Dye allergy? No Pre-meds ordered and instructions given? No, not needed Letter method: Given at Afib clinic  H&P: 1/27 Device: No  Follow-up:  Cassie/Angel, please schedule Routine.  Covering RN - please send this message to [employer], EP scheduler, EP Scheduling pool, EP [employer], and CT scheduler (Brittany Lynch/Stephanie Mogg), if indicated.

## 2024-07-16 ENCOUNTER — Other Ambulatory Visit: Payer: Self-pay | Admitting: Cardiology

## 2024-07-16 LAB — CBC
Hematocrit: 56.5 % — ABNORMAL HIGH (ref 37.5–51.0)
Hemoglobin: 18.9 g/dL — ABNORMAL HIGH (ref 13.0–17.7)
MCH: 29.2 pg (ref 26.6–33.0)
MCHC: 33.5 g/dL (ref 31.5–35.7)
MCV: 87 fL (ref 79–97)
Platelets: 263 10*3/uL (ref 150–450)
RBC: 6.47 x10E6/uL — ABNORMAL HIGH (ref 4.14–5.80)
RDW: 14 % (ref 11.6–15.4)
WBC: 6.8 10*3/uL (ref 3.4–10.8)

## 2024-07-16 NOTE — Pre-Procedure Instructions (Signed)
 Instructed patient on the following items: Arrival time 1100 Nothing to eat or drink after midnight No meds AM of procedure Responsible person to drive you home and stay with you for 24 hrs  Have you missed any doses of anti-coagulant Xarleto- takes once a day, hasn't missed any doses in last 4 weeks.

## 2024-07-17 ENCOUNTER — Ambulatory Visit (HOSPITAL_COMMUNITY)
Admission: RE | Admit: 2024-07-17 | Discharge: 2024-07-17 | Disposition: A | Discharge status: Home / Self Care | Attending: Cardiology | Admitting: Cardiology

## 2024-07-17 ENCOUNTER — Ambulatory Visit (HOSPITAL_BASED_OUTPATIENT_CLINIC_OR_DEPARTMENT_OTHER): Payer: Self-pay | Admitting: Certified Registered"

## 2024-07-17 ENCOUNTER — Encounter (HOSPITAL_COMMUNITY): Payer: Self-pay | Admitting: Certified Registered"

## 2024-07-17 ENCOUNTER — Encounter (HOSPITAL_COMMUNITY): Payer: Self-pay | Admitting: Cardiology

## 2024-07-17 ENCOUNTER — Encounter (HOSPITAL_COMMUNITY): Admission: RE | Disposition: A | Payer: Self-pay | Attending: Cardiology

## 2024-07-17 ENCOUNTER — Ambulatory Visit (HOSPITAL_COMMUNITY): Payer: Self-pay | Admitting: Physician Assistant

## 2024-07-17 DIAGNOSIS — E119 Type 2 diabetes mellitus without complications: Secondary | ICD-10-CM | POA: Diagnosis not present

## 2024-07-17 DIAGNOSIS — Z79899 Other long term (current) drug therapy: Secondary | ICD-10-CM | POA: Insufficient documentation

## 2024-07-17 DIAGNOSIS — I10 Essential (primary) hypertension: Secondary | ICD-10-CM | POA: Insufficient documentation

## 2024-07-17 DIAGNOSIS — I4819 Other persistent atrial fibrillation: Secondary | ICD-10-CM | POA: Diagnosis present

## 2024-07-17 DIAGNOSIS — Z6832 Body mass index (BMI) 32.0-32.9, adult: Secondary | ICD-10-CM | POA: Diagnosis not present

## 2024-07-17 DIAGNOSIS — I4891 Unspecified atrial fibrillation: Secondary | ICD-10-CM

## 2024-07-17 DIAGNOSIS — Z87891 Personal history of nicotine dependence: Secondary | ICD-10-CM

## 2024-07-17 DIAGNOSIS — Z7901 Long term (current) use of anticoagulants: Secondary | ICD-10-CM | POA: Diagnosis not present

## 2024-07-17 DIAGNOSIS — Z7984 Long term (current) use of oral hypoglycemic drugs: Secondary | ICD-10-CM | POA: Diagnosis not present

## 2024-07-17 DIAGNOSIS — D6869 Other thrombophilia: Secondary | ICD-10-CM | POA: Insufficient documentation

## 2024-07-17 LAB — GLUCOSE, CAPILLARY
Glucose-Capillary: 165 mg/dL — ABNORMAL HIGH (ref 70–99)
Glucose-Capillary: 112 mg/dL — ABNORMAL HIGH (ref 70–99)

## 2024-07-17 LAB — POCT ACTIVATED CLOTTING TIME: Activated Clotting Time: 296 s

## 2024-07-17 MED ORDER — ACETAMINOPHEN 325 MG PO TABS: 650.0000 mg | ORAL_TABLET | ORAL | Status: DC | PRN

## 2024-07-17 MED ORDER — SUGAMMADEX SODIUM 200 MG/2ML IV SOLN
INTRAVENOUS | Status: DC | PRN
  Administered 2024-07-17: 200 mg via INTRAVENOUS

## 2024-07-17 MED ORDER — SODIUM CHLORIDE 0.9 % IV SOLN: INTRAVENOUS | Status: DC

## 2024-07-17 MED ORDER — SODIUM CHLORIDE 0.9% FLUSH: 3.0000 mL | Freq: Two times a day (BID) | INTRAVENOUS | Status: DC

## 2024-07-17 MED ORDER — FENTANYL CITRATE (PF) 250 MCG/5ML IJ SOLN
INTRAMUSCULAR | Status: DC | PRN
  Administered 2024-07-17: 100 ug via INTRAVENOUS

## 2024-07-17 MED ORDER — HEPARIN SODIUM (PORCINE) 1000 UNIT/ML IJ SOLN
INTRAMUSCULAR | Status: AC
  Filled 2024-07-17: qty 10

## 2024-07-17 MED ORDER — ATROPINE SULFATE 1 MG/10ML IJ SOSY
PREFILLED_SYRINGE | INTRAMUSCULAR | Status: AC
  Filled 2024-07-17: qty 10

## 2024-07-17 MED ORDER — PHENYLEPHRINE 80 MCG/ML (10ML) SYRINGE FOR IV PUSH (FOR BLOOD PRESSURE SUPPORT)
PREFILLED_SYRINGE | INTRAVENOUS | Status: DC | PRN
  Administered 2024-07-17: 160 ug via INTRAVENOUS
  Administered 2024-07-17 (×2): 80 ug via INTRAVENOUS
  Administered 2024-07-17: 160 ug via INTRAVENOUS

## 2024-07-17 MED ORDER — DEXAMETHASONE SOD PHOSPHATE PF 10 MG/ML IJ SOLN
INTRAMUSCULAR | Status: DC | PRN
  Administered 2024-07-17: 5 mg via INTRAVENOUS

## 2024-07-17 MED ORDER — LIDOCAINE 2% (20 MG/ML) 5 ML SYRINGE
INTRAMUSCULAR | Status: DC | PRN
  Administered 2024-07-17: 100 mg via INTRAVENOUS

## 2024-07-17 MED ORDER — PROPOFOL 10 MG/ML IV BOLUS
INTRAVENOUS | Status: DC | PRN
  Administered 2024-07-17: 150 mg via INTRAVENOUS

## 2024-07-17 MED ORDER — PHENYLEPHRINE HCL-NACL 20-0.9 MG/250ML-% IV SOLN
INTRAVENOUS | Status: DC | PRN
  Administered 2024-07-17: 50 ug/min via INTRAVENOUS

## 2024-07-17 MED ORDER — ATROPINE SULFATE 1 MG/10ML IJ SOSY
PREFILLED_SYRINGE | INTRAMUSCULAR | Status: DC | PRN
  Administered 2024-07-17: 1 mg via INTRAVENOUS

## 2024-07-17 MED ORDER — SODIUM CHLORIDE 0.9 % IV SOLN: 250.0000 mL | INTRAVENOUS | Status: DC | PRN

## 2024-07-17 MED ORDER — HEPARIN (PORCINE) IN NACL 1000-0.9 UT/500ML-% IV SOLN
INTRAVENOUS | Status: DC | PRN
  Administered 2024-07-17 (×2): 500 mL

## 2024-07-17 MED ORDER — FENTANYL CITRATE (PF) 100 MCG/2ML IJ SOLN
INTRAMUSCULAR | Status: AC
  Filled 2024-07-17: qty 2

## 2024-07-17 MED ORDER — SODIUM CHLORIDE 0.9% FLUSH: 3.0000 mL | INTRAVENOUS | Status: DC | PRN

## 2024-07-17 MED ORDER — HEPARIN SODIUM (PORCINE) 1000 UNIT/ML IJ SOLN
INTRAMUSCULAR | Status: DC | PRN
  Administered 2024-07-17: 15000 [IU] via INTRAVENOUS
  Administered 2024-07-17: 6000 [IU] via INTRAVENOUS

## 2024-07-17 MED ORDER — PROTAMINE SULFATE 10 MG/ML IV SOLN
INTRAVENOUS | Status: DC | PRN
  Administered 2024-07-17: 40 mg via INTRAVENOUS

## 2024-07-17 MED ORDER — HEPARIN SODIUM (PORCINE) 1000 UNIT/ML IJ SOLN
INTRAMUSCULAR | Status: DC | PRN
  Administered 2024-07-17: 1000 [IU] via INTRAVENOUS

## 2024-07-17 MED ORDER — ROCURONIUM BROMIDE 10 MG/ML (PF) SYRINGE
PREFILLED_SYRINGE | INTRAVENOUS | Status: DC | PRN
  Administered 2024-07-17: 20 mg via INTRAVENOUS
  Administered 2024-07-17: 50 mg via INTRAVENOUS

## 2024-07-17 MED ORDER — ONDANSETRON HCL 4 MG/2ML IJ SOLN: 4.0000 mg | Freq: Four times a day (QID) | INTRAMUSCULAR | Status: DC | PRN

## 2024-07-17 MED ORDER — ONDANSETRON HCL 4 MG/2ML IJ SOLN
INTRAMUSCULAR | Status: DC | PRN
  Administered 2024-07-17: 4 mg via INTRAVENOUS

## 2024-07-17 NOTE — Transfer of Care (Signed)
 Immediate Anesthesia Transfer of Care Note  Patient: Frank Chase  Procedure(s) Performed: ATRIAL FIBRILLATION ABLATION  Patient Location: PACU  Anesthesia Type:General  Level of Consciousness: awake, alert , and oriented  Airway & Oxygen Therapy: Patient Spontanous Breathing and Patient connected to nasal cannula oxygen  Post-op Assessment: Report given to RN and Post -op Vital signs reviewed and stable  Post vital signs: Reviewed and stable  Last Vitals:  Vitals Value Taken Time  BP    Temp    Pulse 70 07/17/24 14:39  Resp 19 07/17/24 14:39  SpO2 93 % 07/17/24 14:39  Vitals shown include unfiled device data.  Last Pain:  Vitals:   07/17/24 1108  TempSrc:   PainSc: 0-No pain         Complications: There were no known notable events for this encounter.

## 2024-07-17 NOTE — Discharge Instructions (Signed)

## 2024-07-17 NOTE — Interval H&P Note (Signed)
 History and Physical Interval Note:  07/17/2024 11:12 AM  Frank Chase  has presented today for surgery, with the diagnosis of afib.  The various methods of treatment have been discussed with the patient and family. After consideration of risks, benefits and other options for treatment, the patient has consented to  Procedures: ATRIAL FIBRILLATION ABLATION (N/A) as a surgical intervention.  The patient's history has been reviewed, patient examined, no change in status, stable for surgery.  I have reviewed the patient's chart and labs.  Questions were answered to the patient's satisfaction.     Joseeduardo Brix [employer]

## 2024-07-17 NOTE — Anesthesia Preprocedure Evaluation (Signed)
"                                    Anesthesia Evaluation  Patient identified by MRN, date of birth, ID band Patient awake    Reviewed: Allergy & Precautions, NPO status , Patient's Chart, lab work & pertinent test results  History of Anesthesia Complications (+) PONV and history of anesthetic complications  Airway Mallampati: III  TM Distance: >3 FB Neck ROM: Full    Dental no notable dental hx. (+) Teeth Intact   Pulmonary neg pulmonary ROS, neg sleep apnea, neg COPD, Patient abstained from smoking.Not current smoker, former smoker   Pulmonary exam normal breath sounds clear to auscultation       Cardiovascular Exercise Tolerance: Good METShypertension, Pt. on medications (-) CAD and (-) Past MI + dysrhythmias Atrial Fibrillation  Rhythm:Irregular Rate:Normal - Systolic murmurs    Neuro/Psych negative neurological ROS  negative psych ROS   GI/Hepatic ,neg GERD  ,,(+)     (-) substance abuse    Endo/Other  diabetes, Well Controlled, Oral Hypoglycemic Agents    Renal/GU negative Renal ROS     Musculoskeletal   Abdominal   Peds  Hematology   Anesthesia Other Findings Past Medical History: No date: Atrial fibrillation (HCC) No date: Diabetes mellitus without complication (HCC) No date: Hypertension  Reproductive/Obstetrics                              Anesthesia Physical Anesthesia Plan  ASA: 3  Anesthesia Plan: General   Post-op Pain Management: Minimal or no pain anticipated   Induction: Intravenous  PONV Risk Score and Plan: 3 and Ondansetron , Dexamethasone  and Treatment may vary due to age or medical condition  Airway Management Planned: Oral ETT  Additional Equipment: None  Intra-op Plan:   Post-operative Plan: Extubation in OR  Informed Consent: I have reviewed the patients History and Physical, chart, labs and discussed the procedure including the risks, benefits and alternatives for the proposed  anesthesia with the patient or authorized representative who has indicated his/her understanding and acceptance.     Dental advisory given  Plan Discussed with: CRNA and Surgeon  Anesthesia Plan Comments: (Discussed risks of anesthesia with patient, including PONV, sore throat, lip/dental/eye damage. Rare risks discussed as well, such as cardiorespiratory and neurological sequelae, and allergic reactions. Discussed the role of CRNA in patient's perioperative care. Patient understands.)         Anesthesia Quick Evaluation  "

## 2024-07-17 NOTE — Anesthesia Procedure Notes (Signed)
 Procedure Name: Intubation Date/Time: 07/17/2024 1:22 PM  Performed by: Delores Dus, CRNAPre-anesthesia Checklist: Patient identified, Emergency Drugs available, Suction available and Patient being monitored Patient Re-evaluated:Patient Re-evaluated prior to induction Oxygen Delivery Method: Circle system utilized Preoxygenation: Pre-oxygenation with 100% oxygen Induction Type: IV induction Ventilation: Mask ventilation without difficulty Laryngoscope Size: Miller and 2 Grade View: Grade II Tube type: Oral Tube size: 7.0 mm Number of attempts: 1 Airway Equipment and Method: Stylet and Oral airway Placement Confirmation: ETT inserted through vocal cords under direct vision, positive ETCO2 and breath sounds checked- equal and bilateral Secured at: 23 cm Tube secured with: Tape Dental Injury: Teeth and Oropharynx as per pre-operative assessment

## 2024-07-17 NOTE — Anesthesia Postprocedure Evaluation (Signed)
"   Anesthesia Post Note  Patient: Frank Chase  Procedure(s) Performed: ATRIAL FIBRILLATION ABLATION     Patient location during evaluation: PACU Anesthesia Type: General Level of consciousness: awake and alert Pain management: pain level controlled Vital Signs Assessment: post-procedure vital signs reviewed and stable Respiratory status: spontaneous breathing, nonlabored ventilation, respiratory function stable and patient connected to nasal cannula oxygen Cardiovascular status: blood pressure returned to baseline and stable Postop Assessment: no apparent nausea or vomiting Anesthetic complications: no   There were no known notable events for this encounter.  Last Vitals:  Vitals:   07/17/24 1452 07/17/24 1455  BP: 113/69 110/67  Pulse: 69 68  Resp: (!) 0 13  Temp:    SpO2: 93% 93%    Last Pain:  Vitals:   07/17/24 1108  TempSrc:   PainSc: 0-No pain   Pain Goal:                   Rome Ade      "

## 2024-07-17 NOTE — Progress Notes (Signed)
 Patient ambulated to the bathroom and was able to void. No bleeding or hematoma noted to bilateral groin sites. Dressings to sites clean, dry, and intact. Patient ambulated back to the room and got dressed for discharge. No changes to sites.

## 2024-07-18 MED FILL — Fentanyl Citrate Preservative Free (PF) Inj 100 MCG/2ML: INTRAMUSCULAR | Qty: 2 | Status: AC

## 2024-07-21 ENCOUNTER — Encounter (HOSPITAL_COMMUNITY): Payer: Self-pay

## 2024-07-21 ENCOUNTER — Ambulatory Visit (HOSPITAL_COMMUNITY): Admit: 2024-07-21

## 2024-08-15 ENCOUNTER — Ambulatory Visit (HOSPITAL_COMMUNITY): Admitting: Physician Assistant

## 2024-10-15 ENCOUNTER — Ambulatory Visit (HOSPITAL_COMMUNITY): Admitting: Physician Assistant

## 2024-12-22 ENCOUNTER — Ambulatory Visit (HOSPITAL_COMMUNITY): Admitting: Physician Assistant
